# Patient Record
Sex: Female | Born: 1985 | Race: Black or African American | Hispanic: No | Marital: Single | Smoking: Never smoker
Health system: Southern US, Community
[De-identification: ages and names within clinical notes are randomized; demographics above are authoritative.]

## PROBLEM LIST (undated history)

## (undated) ENCOUNTER — Inpatient Hospital Stay (HOSPITAL_COMMUNITY): Payer: Self-pay

## (undated) DIAGNOSIS — O26879 Cervical shortening, unspecified trimester: Secondary | ICD-10-CM

## (undated) DIAGNOSIS — A630 Anogenital (venereal) warts: Secondary | ICD-10-CM

## (undated) DIAGNOSIS — IMO0002 Reserved for concepts with insufficient information to code with codable children: Secondary | ICD-10-CM

## (undated) DIAGNOSIS — M329 Systemic lupus erythematosus, unspecified: Secondary | ICD-10-CM

## (undated) HISTORY — PX: CHOLECYSTECTOMY: SHX55

## (undated) HISTORY — PX: OTHER SURGICAL HISTORY: SHX169

## (undated) HISTORY — DX: Anogenital (venereal) warts: A63.0

## (undated) HISTORY — DX: Cervical shortening, unspecified trimester: O26.879

---

## 2012-05-31 DIAGNOSIS — M329 Systemic lupus erythematosus, unspecified: Secondary | ICD-10-CM | POA: Insufficient documentation

## 2014-09-20 NOTE — L&D Delivery Note (Signed)
Delivery Note At 12:08 PM a viable and preterm female was delivered via Vaginal, Spontaneous Delivery (Presentation: Left Occiput Anterior).  APGAR: 4, 7; weight 2 lb 8.9 oz (1160 g).   Placenta status: Intact, Spontaneous.  Cord: 3 vessels with the following complications: None.   Anesthesia: Epidural  Episiotomy: None Lacerations: Labial;1st degree Suture Repair: 4.0 monocryl Est. Blood Loss (mL): 300  Mom to postpartum.  Baby to NICU.  Anne Mcdowell is a 29 y.o. female [phone] with IUP at 6969w1d admitted for preterm labor with history of LTCS x 1 and desire for TOLAC. She was s/p BMZ x 2 on admission with pessary placed. PPROM occurred on 10/27 while pt admitted. Pessary removed and latency abx given. Pt received 24 hour course of Magnesium Sulfate for neuroprotection during hospital course. [facility] with suspected active labor on 08/05/15.  A second course of BMZ was started with one injection on 11/15 at 0530.  Magnesium Sulfate was restarted for neuroprotection.  Pt received epidural for pain relief, then felt urge to push and was 5 cm, then quickly 10 cm per RN exam.  CNM called to room.  NICU team called and arrived.  Pt pushed ~10 minutes to deliver.  Cord clamped and cut by CNM and infant taken to NICU team.  Placenta intact and spontaneous, bleeding minimal.  First degree labial right and left lacerations.  Left labial laceration repaired with 4.0 monocryl, right labial hemostatic and not repaired.  Infant taken to NICU and FOB went with team to NICU.  Mom stable prior to transfer to postpartum.    LEFTWICH-KIRBY, LISA 08/05/2015, 1:59 PM

## 2015-02-23 ENCOUNTER — Encounter (HOSPITAL_COMMUNITY): Payer: Self-pay | Admitting: *Deleted

## 2015-02-23 ENCOUNTER — Emergency Department (HOSPITAL_COMMUNITY)
Admission: EM | Admit: 2015-02-23 | Discharge: 2015-02-23 | Disposition: A | Discharge status: Home / Self Care | Payer: 59 | Attending: Emergency Medicine | Admitting: Emergency Medicine

## 2015-02-23 ENCOUNTER — Emergency Department (HOSPITAL_COMMUNITY): Payer: 59

## 2015-02-23 DIAGNOSIS — O209 Hemorrhage in early pregnancy, unspecified: Secondary | ICD-10-CM | POA: Insufficient documentation

## 2015-02-23 DIAGNOSIS — Z8739 Personal history of other diseases of the musculoskeletal system and connective tissue: Secondary | ICD-10-CM | POA: Diagnosis not present

## 2015-02-23 DIAGNOSIS — Z880 Allergy status to penicillin: Secondary | ICD-10-CM | POA: Insufficient documentation

## 2015-02-23 DIAGNOSIS — O9989 Other specified diseases and conditions complicating pregnancy, childbirth and the puerperium: Secondary | ICD-10-CM | POA: Diagnosis not present

## 2015-02-23 DIAGNOSIS — R109 Unspecified abdominal pain: Secondary | ICD-10-CM | POA: Diagnosis not present

## 2015-02-23 DIAGNOSIS — N939 Abnormal uterine and vaginal bleeding, unspecified: Secondary | ICD-10-CM

## 2015-02-23 DIAGNOSIS — Z3490 Encounter for supervision of normal pregnancy, unspecified, unspecified trimester: Secondary | ICD-10-CM

## 2015-02-23 DIAGNOSIS — Z3A01 Less than 8 weeks gestation of pregnancy: Secondary | ICD-10-CM | POA: Diagnosis not present

## 2015-02-23 HISTORY — DX: Systemic lupus erythematosus, unspecified: M32.9

## 2015-02-23 HISTORY — DX: Reserved for concepts with insufficient information to code with codable children: IMO0002

## 2015-02-23 LAB — COMPREHENSIVE METABOLIC PANEL
ALT: 14 U/L (ref 14–54)
AST: 19 U/L (ref 15–41)
Albumin: 3.9 g/dL (ref 3.5–5.0)
Alkaline Phosphatase: 44 U/L (ref 38–126)
Anion gap: 6 (ref 5–15)
BILIRUBIN TOTAL: 0.3 mg/dL (ref 0.3–1.2)
BUN: 7 mg/dL (ref 6–20)
CALCIUM: 9.3 mg/dL (ref 8.9–10.3)
CHLORIDE: 105 mmol/L (ref 101–111)
CO2: 25 mmol/L (ref 22–32)
CREATININE: 0.74 mg/dL (ref 0.44–1.00)
GFR calc non Af Amer: >60 mL/min (ref >60)
Glucose, Bld: 100 mg/dL — ABNORMAL HIGH (ref 65–99)
Potassium: 4 mmol/L (ref 3.5–5.1)
Sodium: 136 mmol/L (ref 135–145)
Total Protein: 7.6 g/dL (ref 6.5–8.1)

## 2015-02-23 LAB — CBC
HCT: 34.5 % — ABNORMAL LOW (ref 36.0–46.0)
HEMOGLOBIN: 11.8 g/dL — AB (ref 12.0–15.0)
MCH: 25.7 pg — AB (ref 26.0–34.0)
MCHC: 34.2 g/dL (ref 30.0–36.0)
MCV: 75.2 fL — ABNORMAL LOW (ref 78.0–100.0)
PLATELETS: 178 10*3/uL (ref 150–400)
RBC: 4.59 MIL/uL (ref 3.87–5.11)
RDW: 13.3 % (ref 11.5–15.5)
WBC: 3.4 10*3/uL — AB (ref 4.0–10.5)

## 2015-02-23 LAB — URINALYSIS, ROUTINE W REFLEX MICROSCOPIC
Bilirubin Urine: NEGATIVE
GLUCOSE, UA: NEGATIVE mg/dL
Ketones, ur: NEGATIVE mg/dL
Nitrite: NEGATIVE
Protein, ur: NEGATIVE mg/dL
SPECIFIC GRAVITY, URINE: 1.015 (ref 1.005–1.030)
Urobilinogen, UA: 0.2 mg/dL (ref 0.0–1.0)
pH: 6 (ref 5.0–8.0)

## 2015-02-23 LAB — URINE MICROSCOPIC-ADD ON

## 2015-02-23 LAB — ABO/RH: ABO/RH(D): O POS

## 2015-02-23 LAB — I-STAT BETA HCG BLOOD, ED (MC, WL, AP ONLY): I-stat hCG, quantitative: >2000 m[IU]/mL — ABNORMAL HIGH (ref <5)

## 2015-02-23 LAB — WET PREP, GENITAL
Trich, Wet Prep: NONE SEEN
Yeast Wet Prep HPF POC: NONE SEEN

## 2015-02-23 LAB — OB RESULTS CONSOLE GC/CHLAMYDIA: Gonorrhea: NEGATIVE

## 2015-02-23 NOTE — ED Notes (Signed)
Patient states" She is tired and hungry and tired of waiting here."  She wants here results now so she can leave.

## 2015-02-23 NOTE — Discharge Instructions (Signed)
First Trimester of Pregnancy The first trimester of pregnancy is from week 1 until the end of week 12 (months 1 through 3). A week after a sperm fertilizes an egg, the egg will implant on the wall of the uterus. This embryo will begin to develop into a baby. Genes from you and your partner are forming the baby. The female genes determine whether the baby is a boy or a girl. At 6-8 weeks, the eyes and face are formed, and the heartbeat can be seen on ultrasound. At the end of 12 weeks, all the baby's organs are formed.  Now that you are pregnant, you will want to do everything you can to have a healthy baby. Two of the most important things are to get good prenatal care and to follow your health care provider's instructions. Prenatal care is all the medical care you receive before the baby's birth. This care will help prevent, find, and treat any problems during the pregnancy and childbirth. BODY CHANGES Your body goes through many changes during pregnancy. The changes vary from woman to woman.   You may gain or lose a couple of pounds at first.  You may feel sick to your stomach (nauseous) and throw up (vomit). If the vomiting is uncontrollable, call your health care provider.  You may tire easily.  You may develop headaches that can be relieved by medicines approved by your health care provider.  You may urinate more often. Painful urination may mean you have a bladder infection.  You may develop heartburn as a result of your pregnancy.  You may develop constipation because certain hormones are causing the muscles that push waste through your intestines to slow down.  You may develop hemorrhoids or swollen, bulging veins (varicose veins).  Your breasts may begin to grow larger and become tender. Your nipples may stick out more, and the tissue that surrounds them (areola) may become darker.  Your gums may bleed and may be sensitive to brushing and flossing.  Dark spots or blotches (chloasma,  mask of pregnancy) may develop on your face. This will likely fade after the baby is born.  Your menstrual periods will stop.  You may have a loss of appetite.  You may develop cravings for certain kinds of food.  You may have changes in your emotions from day to day, such as being excited to be pregnant or being concerned that something may go wrong with the pregnancy and baby.  You may have more vivid and strange dreams.  You may have changes in your hair. These can include thickening of your hair, rapid growth, and changes in texture. Some women also have hair loss during or after pregnancy, or hair that feels dry or thin. Your hair will most likely return to normal after your baby is born. WHAT TO EXPECT AT YOUR PRENATAL VISITS During a routine prenatal visit:  You will be weighed to make sure you and the baby are growing normally.  Your blood pressure will be taken.  Your abdomen will be measured to track your baby's growth.  The fetal heartbeat will be listened to starting around week 10 or 12 of your pregnancy.  Test results from any previous visits will be discussed. Your health care provider may ask you:  How you are feeling.  If you are feeling the baby move.  If you have had any abnormal symptoms, such as leaking fluid, bleeding, severe headaches, or abdominal cramping.  If you have any questions. Other tests   that may be performed during your first trimester include:  Blood tests to find your blood type and to check for the presence of any previous infections. They will also be used to check for low iron levels (anemia) and Rh antibodies. Later in the pregnancy, blood tests for diabetes will be done along with other tests if problems develop.  Urine tests to check for infections, diabetes, or protein in the urine.  An ultrasound to confirm the proper growth and development of the baby.  An amniocentesis to check for possible genetic problems.  Fetal screens for  spina bifida and Down syndrome.  You may need other tests to make sure you and the baby are doing well. HOME CARE INSTRUCTIONS  Medicines  Follow your health care provider's instructions regarding medicine use. Specific medicines may be either safe or unsafe to take during pregnancy.  Take your prenatal vitamins as directed.  If you develop constipation, try taking a stool softener if your health care provider approves. Diet  Eat regular, well-balanced meals. Choose a variety of foods, such as meat or vegetable-based protein, fish, milk and low-fat dairy products, vegetables, fruits, and whole grain breads and cereals. Your health care provider will help you determine the amount of weight gain that is right for you.  Avoid raw meat and uncooked cheese. These carry germs that can cause birth defects in the baby.  Eating four or five small meals rather than three large meals a day may help relieve nausea and vomiting. If you start to feel nauseous, eating a few soda crackers can be helpful. Drinking liquids between meals instead of during meals also seems to help nausea and vomiting.  If you develop constipation, eat more high-fiber foods, such as fresh vegetables or fruit and whole grains. Drink enough fluids to keep your urine clear or pale yellow. Activity and Exercise  Exercise only as directed by your health care provider. Exercising will help you:  Control your weight.  Stay in shape.  Be prepared for labor and delivery.  Experiencing pain or cramping in the lower abdomen or low back is a good sign that you should stop exercising. Check with your health care provider before continuing normal exercises.  Try to avoid standing for long periods of time. Move your legs often if you must stand in one place for a long time.  Avoid heavy lifting.  Wear low-heeled shoes, and practice good posture.  You may continue to have sex unless your health care provider directs you  otherwise. Relief of Pain or Discomfort  Wear a good support bra for breast tenderness.   Take warm sitz baths to soothe any pain or discomfort caused by hemorrhoids. Use hemorrhoid cream if your health care provider approves.   Rest with your legs elevated if you have leg cramps or low back pain.  If you develop varicose veins in your legs, wear support hose. Elevate your feet for 15 minutes, 3-4 times a day. Limit salt in your diet. Prenatal Care  Schedule your prenatal visits by the twelfth week of pregnancy. They are usually scheduled monthly at first, then more often in the last 2 months before delivery.  Write down your questions. Take them to your prenatal visits.  Keep all your prenatal visits as directed by your health care provider. Safety  Wear your seat belt at all times when driving.  Make a list of emergency phone numbers, including numbers for family, friends, the hospital, and police and fire departments. General Tips    Ask your health care provider for a referral to a local prenatal education class. Begin classes no later than at the beginning of month 6 of your pregnancy.  Ask for help if you have counseling or nutritional needs during pregnancy. Your health care provider can offer advice or refer you to specialists for help with various needs.  Do not use hot tubs, steam rooms, or saunas.  Do not douche or use tampons or scented sanitary pads.  Do not cross your legs for long periods of time.  Avoid cat litter boxes and soil used by cats. These carry germs that can cause birth defects in the baby and possibly loss of the fetus by miscarriage or stillbirth.  Avoid all smoking, herbs, alcohol, and medicines not prescribed by your health care provider. Chemicals in these affect the formation and growth of the baby.  Schedule a dentist appointment. At home, brush your teeth with a soft toothbrush and be gentle when you floss. SEEK MEDICAL CARE IF:   You have  dizziness.  You have mild pelvic cramps, pelvic pressure, or nagging pain in the abdominal area.  You have persistent nausea, vomiting, or diarrhea.  You have a bad smelling vaginal discharge.  You have pain with urination.  You notice increased swelling in your face, hands, legs, or ankles. SEEK IMMEDIATE MEDICAL CARE IF:   You have a fever.  You are leaking fluid from your vagina.  You have spotting or bleeding from your vagina.  You have severe abdominal cramping or pain.  You have rapid weight gain or loss.  You vomit blood or material that looks like coffee grounds.  You are exposed to German measles and have never had them.  You are exposed to fifth disease or chickenpox.  You develop a severe headache.  You have shortness of breath.  You have any kind of trauma, such as from a fall or a car accident. Document Released: 08/31/2001 Document Revised: 01/21/2014 Document Reviewed: 07/17/2013 Exit[employer] Patient Information 2015 [employer]. This information is not intended to replace advice given to you by your health care provider. Make sure you discuss any questions you have with your health care provider.  

## 2015-02-23 NOTE — ED Provider Notes (Signed)
CSN: [id number]     Arrival date & time 02/23/15  1539 History   First MD Initiated Contact with Patient 02/23/15 1952     Chief Complaint  Patient presents with  . Vaginal Bleeding  . Abdominal Cramping     (Consider location/radiation/quality/duration/timing/severity/associated sxs/prior Treatment) HPI Anne Mcdowell is a 29 y.o. female G4 P 0121 who comes in for evaluation of new onset pregnancy and abdominal cramping. Patient states she got up she was pregnant [redacted] weeks ago, developed some mild abdominal cramping yesterday that was intermittent in nature and resolved on its own area she reports that she started to spot on Friday and Saturday, but denies any heavy bleeding or blood clots. Denies any fevers, chills abdominal pain, nausea or vomiting, chest pain, shortness of breath, urinary symptoms. Patient also complains of feeling like she has "cottonballs stuck in the back of my mouth". Sensation is present for the past week. No difficulties breathing, no cough.  Past Medical History  Diagnosis Date  . Lupus    Past Surgical History  Procedure Laterality Date  . Cholecystectomy    . Cesarean section     No family history on file. History  Substance Use Topics  . Smoking status: Never Smoker   . Smokeless tobacco: Not on file  . Alcohol Use: No   OB History    No data available     Review of Systems  A 10 point review of systems was completed and was negative except for pertinent positives and negatives as mentioned in the history of present illness    Allergies  Penicillins and Vancomycin  Home Medications   Prior to Admission medications   Medication Sig Start Date End Date Taking? Authorizing Provider  citalopram (CELEXA) 20 MG tablet Take 20 mg by mouth daily. 01/24/15  Yes Historical Provider, MD  esomeprazole (NEXIUM) 40 MG capsule Take 40 mg by mouth at bedtime.   Yes Historical Provider, MD  hydroxychloroquine (PLAQUENIL) 200 MG tablet Take 400 mg by mouth  daily. 01/24/15  Yes Historical Provider, MD   BP 118/60 mmHg  Pulse 90  Temp(Src) 98.6 F (37 C) (Oral)  Resp 16  Ht 5' (1.524 m)  Wt 176 lb (79.833 kg)  BMI 34.37 kg/m2  SpO2 100%  LMP 02/04/2015 Physical Exam  Constitutional: She is oriented to person, place, and time. She appears well-developed and well-nourished.  HENT:  Head: Normocephalic and atraumatic.  Mouth/Throat: Oropharynx is clear and moist.  Oropharynx clear and moist without erythema or exudate. No glossal elevation or trismus. No evidence of retropharyngeal or peritonsillar abscess. Tolerating secretions well. Patent airway.  Eyes: Conjunctivae are normal. Pupils are equal, round, and reactive to light. Right eye exhibits no discharge. Left eye exhibits no discharge. No scleral icterus.  Neck: Neck supple.  Cardiovascular: Normal rate, regular rhythm and normal heart sounds.   Pulmonary/Chest: Effort normal and breath sounds normal. No respiratory distress. She has no wheezes. She has no rales.  Abdominal: Soft. There is no tenderness.  Genitourinary:  Chaperone was present for the entire genital exam. No lesions or rashes appreciated on vulva. Cervix visualized on speculum exam and appropriate cultures sampled. Cervical os is closed Scant blood in vaginal vault. Discharge: Scant clear yellow. Upon bi manual exam- No TTP of the adnexa, no cervical motion tenderness. No fullness or masses appreciated. No abnormalities appreciated in structural anatomy.   Musculoskeletal: She exhibits no tenderness.  Neurological: She is alert and oriented to person, place, and time.  Cranial Nerves II-XII grossly intact  Skin: Skin is warm and dry. No rash noted.  Psychiatric: She has a normal mood and affect.  Nursing note and vitals reviewed.   ED Course  Procedures (including critical care time) Labs Review Labs Reviewed  WET PREP, GENITAL - Abnormal; Notable for the following:    Clue Cells Wet Prep HPF POC FEW (*)     WBC, Wet Prep HPF POC FEW (*)    All other components within normal limits  CBC - Abnormal; Notable for the following:    WBC 3.4 (*)    Hemoglobin 11.8 (*)    HCT 34.5 (*)    MCV 75.2 (*)    MCH 25.7 (*)    All other components within normal limits  URINALYSIS, ROUTINE W REFLEX MICROSCOPIC (NOT AT [facility]) - Abnormal; Notable for the following:    APPearance CLOUDY (*)    Hgb urine dipstick LARGE (*)    Leukocytes, UA MODERATE (*)    All other components within normal limits  COMPREHENSIVE METABOLIC PANEL - Abnormal; Notable for the following:    Glucose, Bld 100 (*)    All other components within normal limits  URINE MICROSCOPIC-ADD ON - Abnormal; Notable for the following:    Squamous Epithelial / LPF FEW (*)    Bacteria, UA FEW (*)    All other components within normal limits  I-STAT BETA HCG BLOOD, ED ([facility], [facility], [facility] ONLY) - Abnormal; Notable for the following:    I-stat hCG, quantitative >2000.0 (*)    All other components within normal limits  HIV ANTIBODY (ROUTINE TESTING)  ABO/RH  GC/CHLAMYDIA PROBE AMP ([place]) NOT AT [facility]    Imaging Review US Ob Comp Less 14 Wks  02/23/2015   CLINICAL DATA:  Pregnant patient with vaginal bleeding for 2 days. Cramping.  EXAM: OBSTETRIC <14 WK [place] AND TRANSVAGINAL OB US  TECHNIQUE: Both transabdominal and transvaginal ultrasound examinations were performed for complete evaluation of the gestation as well as the maternal uterus, adnexal regions, and pelvic cul-de-sac. Transvaginal technique was performed to assess early pregnancy.  COMPARISON:  None.  FINDINGS: Intrauterine gestational sac: Visualized/normal in shape.  Yolk sac:  Present.  Embryo:  Present.  Cardiac Activity: Present.  Heart Rate: 120  bpm  CRL:  2.4  mm   5 w   6 d                  [place] EDC: 10/20/2015  Maternal uterus/adnexae: No subchorionic hemorrhage. The right ovary is normal measuring 1.7 x 1.6 x 3.7 cm blood flow. The left ovary is not seen. Multiple nabothian cysts are in  the cervix. Trace free fluid in the pelvis.  IMPRESSION: Single live intrauterine pregnancy estimated gestational age [redacted] weeks 6 days for estimated date of delivery 10/20/2015.   Electronically Signed   By: Rubye Oaks M.D.   On: 02/23/2015 21:09   US Ob Transvaginal  02/23/2015   CLINICAL DATA:  Pregnant patient with vaginal bleeding for 2 days. Cramping.  EXAM: OBSTETRIC <14 WK [place] AND TRANSVAGINAL OB US  TECHNIQUE: Both transabdominal and transvaginal ultrasound examinations were performed for complete evaluation of the gestation as well as the maternal uterus, adnexal regions, and pelvic cul-de-sac. Transvaginal technique was performed to assess early pregnancy.  COMPARISON:  None.  FINDINGS: Intrauterine gestational sac: Visualized/normal in shape.  Yolk sac:  Present.  Embryo:  Present.  Cardiac Activity: Present.  Heart Rate: 120  bpm  CRL:  2.4  mm   5 w   6 d                  US EDC: 10/20/2015  Maternal uterus/adnexae: No subchorionic hemorrhage. The right ovary is normal measuring 1.7 x 1.6 x 3.7 cm blood flow. The left ovary is not seen. Multiple nabothian cysts are in the cervix. Trace free fluid in the pelvis.  IMPRESSION: Single live intrauterine pregnancy estimated gestational age [redacted] weeks 6 days for estimated date of delivery 10/20/2015.   Electronically Signed   By: Rubye OaksMelanie  Ehinger M.D.   On: 02/23/2015 21:09     EKG Interpretation None     Meds given in ED:  Medications - No data to display  Discharge Medication List as of 02/23/2015 11:17 PM     Filed Vitals:   02/23/15 2136 02/23/15 2200 02/23/15 2230 02/23/15 2330  BP: 113/63 117/63 117/65 118/60  Pulse: 90 109 83 90  Temp:      TempSrc:      Resp: 12  16 16   Height:      Weight:      SpO2: 100% 100% 100% 100%    MDM  Vitals stable - WNL -afebrile Pt resting comfortably in ED. PE: Benign abdominal and pelvic exam. Grossly benign physical exam. Labwork: Patient is O+, Rhogam not required. No evidence of UTI,  suspect a dirty catch. Labs otherwise noncontributory. Imaging: Transvaginal OB ultrasound confirms intrauterine pregnancy, gestational age approximately 5 weeks.  No evidence of ectopic pregnancy, TOA, torsion. Patient given referral to follow up with OB/GYN for definitive prenatal care.  I discussed all relevant lab findings and imaging results with pt and they verbalized understanding. Discussed f/u with PCP within 48 hrs and return precautions, pt very amenable to plan.  Final diagnoses:  Pregnancy  Abdominal cramping        Joycie PeekBenjamin Gordan Grell, PA-C 02/24/15 0002  Mancel BaleElliott Wentz, MD 02/24/15 0010

## 2015-02-23 NOTE — ED Notes (Signed)
Pt states pink spotting on toilet paper and mild abdominal cramping.  Pt has had 1 positive pregnancy test, and thinks she's [redacted] weeks pregnant.  Hx of 2 miscarriages, but states those were different.  Pt also feels as if she has cotton in her throat. No redness noted.  Airway patent.

## 2015-02-24 LAB — GC/CHLAMYDIA PROBE AMP (~~LOC~~) NOT AT ARMC
Chlamydia: NEGATIVE
NEISSERIA GONORRHEA: NEGATIVE

## 2015-02-24 LAB — HIV ANTIBODY (ROUTINE TESTING W REFLEX): HIV SCREEN 4TH GENERATION: NONREACTIVE

## 2015-02-25 ENCOUNTER — Encounter (HOSPITAL_COMMUNITY): Payer: Self-pay | Admitting: *Deleted

## 2015-02-25 ENCOUNTER — Inpatient Hospital Stay (HOSPITAL_COMMUNITY)
Admission: AD | Admit: 2015-02-25 | Discharge: 2015-02-25 | Disposition: A | Discharge status: Home / Self Care | Payer: 59 | Source: Ambulatory Visit | Attending: Obstetrics & Gynecology | Admitting: Obstetrics & Gynecology

## 2015-02-25 DIAGNOSIS — N760 Acute vaginitis: Secondary | ICD-10-CM

## 2015-02-25 DIAGNOSIS — D6862 Lupus anticoagulant syndrome: Secondary | ICD-10-CM | POA: Diagnosis not present

## 2015-02-25 DIAGNOSIS — O209 Hemorrhage in early pregnancy, unspecified: Secondary | ICD-10-CM | POA: Diagnosis present

## 2015-02-25 DIAGNOSIS — B9689 Other specified bacterial agents as the cause of diseases classified elsewhere: Secondary | ICD-10-CM

## 2015-02-25 DIAGNOSIS — O99111 Other diseases of the blood and blood-forming organs and certain disorders involving the immune mechanism complicating pregnancy, first trimester: Secondary | ICD-10-CM | POA: Diagnosis not present

## 2015-02-25 DIAGNOSIS — Z3A01 Less than 8 weeks gestation of pregnancy: Secondary | ICD-10-CM | POA: Insufficient documentation

## 2015-02-25 DIAGNOSIS — O210 Mild hyperemesis gravidarum: Secondary | ICD-10-CM | POA: Diagnosis not present

## 2015-02-25 LAB — URINALYSIS, ROUTINE W REFLEX MICROSCOPIC
BILIRUBIN URINE: NEGATIVE
Glucose, UA: NEGATIVE mg/dL
KETONES UR: NEGATIVE mg/dL
NITRITE: NEGATIVE
Protein, ur: NEGATIVE mg/dL
UROBILINOGEN UA: 0.2 mg/dL (ref 0.0–1.0)
pH: 5.5 (ref 5.0–8.0)

## 2015-02-25 LAB — WET PREP, GENITAL
TRICH WET PREP: NONE SEEN
YEAST WET PREP: NONE SEEN

## 2015-02-25 LAB — URINE MICROSCOPIC-ADD ON

## 2015-02-25 MED ORDER — METOCLOPRAMIDE HCL 10 MG PO TABS
10.0000 mg | ORAL_TABLET | Freq: Once | ORAL | Status: AC
  Administered 2015-02-25: 10 mg via ORAL
  Filled 2015-02-25: qty 1

## 2015-02-25 MED ORDER — METRONIDAZOLE 500 MG PO TABS: 500.0000 mg | ORAL_TABLET | Freq: Two times a day (BID) | ORAL | Status: DC

## 2015-02-25 MED ORDER — METOCLOPRAMIDE HCL 10 MG PO TABS: 10.0000 mg | ORAL_TABLET | Freq: Four times a day (QID) | ORAL | Status: DC

## 2015-02-25 NOTE — MAU Provider Note (Signed)
History     CSN: [id number]  Arrival date and time: 02/25/15 1214   None     Chief Complaint  Patient presents with  . Vaginal Bleeding   HPI Pt is 2265w1d pregnant and was seen on 02/23/2015 for small amount of bleeding.- had confirmed IUP Pt denies clots or heavy bleeding- some cramps; pt has had vaginal odor- had wet prep on 6/5 that showed a few clue cells; had neg GC/chlamydia Pt has also been nauseated but not vomiting; has not taken anything for the nausea; pt is not eating b/c of nausea Pt has Lupus- [facility]  [place] 2 [place] ago - [facility] RN Note  Expand All Collapse All   Started bleeding on Friday, bleeding a little heavier today. Still having a little cramping. Called [facility] and was told to come here.           Past Medical History  Diagnosis Date  . Lupus     Past Surgical History  Procedure Laterality Date  . Cholecystectomy    . Cesarean section    . Elective abortion      History reviewed. No pertinent family history.  History  Substance Use Topics  . Smoking status: Never Smoker   . Smokeless tobacco: Not on file  . Alcohol Use: No    Allergies:  Allergies  Allergen Reactions  . Penicillins Hives and Swelling  . Vancomycin Swelling    Prescriptions prior to admission  Medication Sig Dispense Refill Last Dose  . citalopram (CELEXA) 20 MG tablet Take 20 mg by mouth daily.  0 Past Week at Unknown time  . esomeprazole (NEXIUM) 40 MG capsule Take 40 mg by mouth at bedtime.   02/22/2015 at Unknown time  . hydroxychloroquine (PLAQUENIL) 200 MG tablet Take 400 mg by mouth daily.  0 02/22/2015 at Unknown time    Review of Systems  Constitutional: Negative for fever and chills.  Gastrointestinal: Positive for nausea and abdominal pain. Negative for vomiting, diarrhea and constipation.  Genitourinary: Negative for dysuria and urgency.  Neurological: Negative for headaches.   Physical Exam   Blood  pressure 116/69, pulse 86, temperature 98.4 F (36.9 C), temperature source Oral, resp. rate 18, height 4' 11.5" (1.511 m), weight 173 lb (78.472 kg), last menstrual period 02/04/2015.  Physical Exam  Nursing note and vitals reviewed. Constitutional: She is oriented to person, place, and time. She appears well-developed and well-nourished. No distress.  HENT:  Head: Normocephalic.  Eyes: Pupils are equal, round, and reactive to light.  Neck: Normal range of motion. Neck supple.  Cardiovascular: Normal rate.   Respiratory: Effort normal.  GI: Soft. She exhibits no distension. There is no tenderness. There is no rebound and no guarding.  Genitourinary:  Small amount of dark red blood in vault; cervix closed, NT; uterus NSSC NT; adnexa without palpable enlargement or tenderness  Musculoskeletal: Normal range of motion.  Neurological: She is alert and oriented to person, place, and time.  Skin: Skin is warm and dry.  Psychiatric: She has a normal mood and affect.    MAU Course  Procedures Results for orders placed or performed during the hospital encounter of 02/25/15 (from the past 24 hour(s))  Urinalysis, Routine w reflex microscopic (not [facility])     Status: Abnormal   Collection Time: 02/25/15 12:32 PM  Result Value Ref Range   Color, Urine YELLOW YELLOW   APPearance CLEAR CLEAR   Specific Gravity,  Urine >1.030 (H) 1.005 - 1.030   pH 5.5 5.0 - 8.0   Glucose, UA NEGATIVE NEGATIVE mg/dL   Hgb urine dipstick LARGE (A) NEGATIVE   Bilirubin Urine NEGATIVE NEGATIVE   Ketones, ur NEGATIVE NEGATIVE mg/dL   Protein, ur NEGATIVE NEGATIVE mg/dL   Urobilinogen, UA 0.2 0.0 - 1.0 mg/dL   Nitrite NEGATIVE NEGATIVE   Leukocytes, UA TRACE (A) NEGATIVE  Urine microscopic-add on     Status: Abnormal   Collection Time: 02/25/15 12:32 PM  Result Value Ref Range   Squamous Epithelial / LPF MANY (A) RARE   WBC, UA 3-6 <3 WBC/hpf   RBC / HPF 3-6 <3 RBC/hpf   Bacteria, UA RARE RARE   Urine-Other  MUCOUS PRESENT   wet prep Reglan  PO given for nausea- which helped Assessment and Plan  Bleeding in pregnancy- SLIUP [redacted]w[redacted]d Nausea- Reglan Lupus F/u HD then refer to HR clinic [facility] 02/25/2015, 12:52 PM

## 2015-02-25 NOTE — MAU Note (Addendum)
Started bleeding on Friday, bleeding a little heavier today.  Still having a little cramping. Called [facility] and was told to come here.

## 2015-02-25 NOTE — MAU Note (Signed)
Pt states she had a pelvic exam on Sunday.

## 2015-02-25 NOTE — Discharge Instructions (Signed)
Safe Medications in Pregnancy   Acne: Benzoyl Peroxide Salicylic Acid  Backache/Headache: Tylenol: 2 regular strength every 4 hours OR              2 Extra strength every 6 hours  Colds/Coughs/Allergies: Benadryl (alcohol free) 25 mg every 6 hours as needed Breath right strips Claritin Cepacol throat lozenges Chloraseptic throat spray Cold-Eeze- up to three times per day Cough drops, alcohol free Flonase (by prescription only) Guaifenesin Mucinex Robitussin DM (plain only, alcohol free) Saline nasal spray/drops Sudafed (pseudoephedrine) & Actifed ** use only after [redacted] weeks gestation and if you do not have high blood pressure Tylenol Vicks Vaporub Zinc lozenges Zyrtec   Constipation: Colace Ducolax suppositories Fleet enema Glycerin suppositories Metamucil Milk of magnesia Miralax Senokot Smooth move tea  Diarrhea: Kaopectate Imodium A-D  *NO pepto Bismol  Hemorrhoids: Anusol Anusol HC Preparation H Tucks  Indigestion: Tums Maalox Mylanta Zantac  Pepcid  Insomnia: Benadryl (alcohol free) 25mg  every 6 hours as needed Tylenol PM Unisom, no Gelcaps  Leg Cramps: Tums MagGel  Nausea/Vomiting:  Bonine Dramamine Emetrol Ginger extract Sea bands Meclizine  Nausea medication to take during pregnancy:  Unisom (doxylamine succinate 25 mg tablets) Take one tablet daily at bedtime. If symptoms are not adequately controlled, the dose can be increased to a maximum recommended dose of two tablets daily (1/2 tablet in the morning, 1/2 tablet mid-afternoon and one at bedtime). Vitamin B6 100mg  tablets. Take one tablet twice a day (up to 200 mg per day).  Skin Rashes: Aveeno products Benadryl cream or 25mg  every 6 hours as needed Calamine Lotion 1% cortisone cream  Yeast infection: Gyne-lotrimin 7 Monistat 7   **If taking multiple medications, please check labels to avoid duplicating the same active ingredients **take medication as directed on  the label ** Do not exceed 4000 mg of tylenol in 24 hours **Do not take medications that contain aspirin or ibuprofe    ________________________________________     To schedule your Maternity Eligibility Appointment, please call [phone].  When you arrive for your appointment you must bring the following items or information listed below.  Your appointment will be rescheduled if you do not have these items or are 15 minutes late. If currently receiving Medicaid, you MUST bring: 1. Medicaid Card 2. Social Security Card 3. Picture ID 4. Proof of Pregnancy 5. Verification of current address if the address on Medicaid card is incorrect "postmarked mail" If not receiving Medicaid, you MUST bring: 1. Social Security Card 2. Picture ID 3. Birth Certificate (if available) Passport or *Green Card 4. Proof of Pregnancy 5. Verification of current address "postmarked mail" for each income presented. 6. Verification of insurance coverage, if any 7. Check stubs from each employer for the previous month (if unable to present check stub  for each week, we will accept check stub for the first and last week ill the same month.) If you can't locate check stubs, you must bring a letter from the employer(s) and it must have the following information on letterhead, typed, in English: o name of company o company telephone number o how long been with the company, if less than one month o how much person earns per hour o how many hours per week work o the gross pay the person earned for the previous month If you are 29 years old or less, you do not have to bring proof of income unless you work or live with the father of the baby and at that time we  will need proof of income from you and/or the father of the baby. Green Card recipients are eligible for Medicaid for Pregnant Women (MPW)

## 2015-03-04 ENCOUNTER — Inpatient Hospital Stay (HOSPITAL_COMMUNITY)
Admission: AD | Admit: 2015-03-04 | Discharge: 2015-03-05 | Discharge status: Against Medical Advice | Payer: Medicaid Other | Source: Ambulatory Visit | Attending: Family Medicine | Admitting: Family Medicine

## 2015-03-04 ENCOUNTER — Encounter (HOSPITAL_COMMUNITY): Payer: Self-pay | Admitting: *Deleted

## 2015-03-04 DIAGNOSIS — Z5320 Procedure and treatment not carried out because of patient's decision for unspecified reasons: Secondary | ICD-10-CM | POA: Diagnosis not present

## 2015-03-04 DIAGNOSIS — O9989 Other specified diseases and conditions complicating pregnancy, childbirth and the puerperium: Secondary | ICD-10-CM | POA: Diagnosis present

## 2015-03-04 LAB — URINE MICROSCOPIC-ADD ON

## 2015-03-04 LAB — URINALYSIS, ROUTINE W REFLEX MICROSCOPIC
Bilirubin Urine: NEGATIVE
Glucose, UA: NEGATIVE mg/dL
Ketones, ur: 15 mg/dL — AB
Nitrite: NEGATIVE
Protein, ur: NEGATIVE mg/dL
Specific Gravity, Urine: >1.03 — ABNORMAL HIGH (ref 1.005–1.030)
Urobilinogen, UA: 0.2 mg/dL (ref 0.0–1.0)
pH: 6 (ref 5.0–8.0)

## 2015-03-04 NOTE — MAU Note (Signed)
PT  SAYS S HE  IS  WEAK  - STARTED  YESTERDAY .    SAYS VOMITING     STARTED ON Friday  .    ATE LAST   BAGEL,   LAST DRANK -WATER.    SAYS     SHE  HAS  SLEPT  ALL DAY  .  PLANS  TO GO  TO CLINIC  FOR  [facility].   LEFT SIDE  STARTED  HURTING  TODAY.    LAST  SEX-  6-3

## 2015-03-04 NOTE — MAU Note (Signed)
ANSWERED  QUESTIONS  AND EXPLAINED  AGAIN  ABOUT  PRIORITY  OF  TRIAGE    PTS.

## 2015-03-05 NOTE — MAU Note (Signed)
Pt not in lobby.  

## 2015-03-11 LAB — CULTURE, OB URINE: Culture: NO GROWTH

## 2015-03-13 ENCOUNTER — Encounter (HOSPITAL_COMMUNITY): Payer: Self-pay | Admitting: *Deleted

## 2015-03-13 ENCOUNTER — Inpatient Hospital Stay (HOSPITAL_COMMUNITY)
Admission: AD | Admit: 2015-03-13 | Discharge: 2015-03-14 | Disposition: A | Discharge status: Home / Self Care | Payer: Medicaid Other | Source: Ambulatory Visit | Attending: Family Medicine | Admitting: Family Medicine

## 2015-03-13 DIAGNOSIS — O210 Mild hyperemesis gravidarum: Secondary | ICD-10-CM | POA: Insufficient documentation

## 2015-03-13 DIAGNOSIS — Z3A08 8 weeks gestation of pregnancy: Secondary | ICD-10-CM | POA: Diagnosis not present

## 2015-03-13 DIAGNOSIS — R197 Diarrhea, unspecified: Secondary | ICD-10-CM | POA: Diagnosis present

## 2015-03-13 DIAGNOSIS — R112 Nausea with vomiting, unspecified: Secondary | ICD-10-CM | POA: Diagnosis not present

## 2015-03-13 DIAGNOSIS — O2341 Unspecified infection of urinary tract in pregnancy, first trimester: Secondary | ICD-10-CM | POA: Diagnosis not present

## 2015-03-13 LAB — URINALYSIS, ROUTINE W REFLEX MICROSCOPIC
Bilirubin Urine: NEGATIVE
Glucose, UA: NEGATIVE mg/dL
Ketones, ur: NEGATIVE mg/dL
Leukocytes, UA: NEGATIVE
Nitrite: POSITIVE — AB
Protein, ur: NEGATIVE mg/dL
Specific Gravity, Urine: >1.03 — ABNORMAL HIGH (ref 1.005–1.030)
Urobilinogen, UA: 0.2 mg/dL (ref 0.0–1.0)
pH: 6 (ref 5.0–8.0)

## 2015-03-13 LAB — COMPREHENSIVE METABOLIC PANEL
ALT: 11 U/L — ABNORMAL LOW (ref 14–54)
AST: 15 U/L (ref 15–41)
Albumin: 3.9 g/dL (ref 3.5–5.0)
Alkaline Phosphatase: 36 U/L — ABNORMAL LOW (ref 38–126)
Anion gap: 4 — ABNORMAL LOW (ref 5–15)
BUN: 6 mg/dL (ref 6–20)
CO2: 25 mmol/L (ref 22–32)
Calcium: 9.1 mg/dL (ref 8.9–10.3)
Chloride: 107 mmol/L (ref 101–111)
Creatinine, Ser: 0.56 mg/dL (ref 0.44–1.00)
GFR calc Af Amer: >60 mL/min (ref >60)
GFR calc non Af Amer: >60 mL/min (ref >60)
Glucose, Bld: 93 mg/dL (ref 65–99)
Potassium: 3.7 mmol/L (ref 3.5–5.1)
Sodium: 136 mmol/L (ref 135–145)
Total Bilirubin: 0.2 mg/dL — ABNORMAL LOW (ref 0.3–1.2)
Total Protein: 7 g/dL (ref 6.5–8.1)

## 2015-03-13 LAB — CBC
HCT: 35.5 % — ABNORMAL LOW (ref 36.0–46.0)
Hemoglobin: 12.5 g/dL (ref 12.0–15.0)
MCH: 26.2 pg (ref 26.0–34.0)
MCHC: 35.2 g/dL (ref 30.0–36.0)
MCV: 74.4 fL — ABNORMAL LOW (ref 78.0–100.0)
Platelets: 174 10*3/uL (ref 150–400)
RBC: 4.77 MIL/uL (ref 3.87–5.11)
RDW: 13.3 % (ref 11.5–15.5)
WBC: 4.9 10*3/uL (ref 4.0–10.5)

## 2015-03-13 LAB — URINE MICROSCOPIC-ADD ON

## 2015-03-13 MED ORDER — SODIUM CHLORIDE 0.9 % IV SOLN
25.0000 mg | Freq: Once | INTRAVENOUS | Status: AC
  Administered 2015-03-13: 25 mg via INTRAVENOUS
  Filled 2015-03-13: qty 1

## 2015-03-13 MED ORDER — LOPERAMIDE HCL 2 MG PO CAPS
4.0000 mg | ORAL_CAPSULE | Freq: Once | ORAL | Status: AC
  Administered 2015-03-14: 4 mg via ORAL
  Filled 2015-03-13: qty 2

## 2015-03-13 NOTE — MAU Note (Addendum)
Pt reports N/V/D for 2 days. Pt has antiemetics at home. Has not taken them since last week because "the medicine doesn't work, it just makes me sleepy". Pt also reports a fever blister on her lip and would like some medicine for it. Pt also reports bleeding off and on with this pregnancy. Last episode 2 days ago

## 2015-03-14 DIAGNOSIS — R112 Nausea with vomiting, unspecified: Secondary | ICD-10-CM

## 2015-03-14 MED ORDER — SODIUM CHLORIDE 0.9 % IV SOLN
8.0000 mg | Freq: Once | INTRAVENOUS | Status: DC
  Filled 2015-03-14: qty 4

## 2015-03-14 MED ORDER — LACTATED RINGERS IV BOLUS (SEPSIS)
1000.0000 mL | Freq: Once | INTRAVENOUS | Status: AC
  Administered 2015-03-14: 1000 mL via INTRAVENOUS

## 2015-03-14 MED ORDER — NITROFURANTOIN MONOHYD MACRO 100 MG PO CAPS: 100.0000 mg | ORAL_CAPSULE | Freq: Two times a day (BID) | ORAL | Status: AC

## 2015-03-14 MED ORDER — PROMETHAZINE HCL 25 MG PO TABS
25.0000 mg | ORAL_TABLET | Freq: Four times a day (QID) | ORAL | Status: DC | PRN
Start: 2015-03-14 — End: 2015-05-01

## 2015-03-14 MED ORDER — METOCLOPRAMIDE HCL 5 MG/ML IJ SOLN
10.0000 mg | Freq: Once | INTRAMUSCULAR | Status: AC
  Administered 2015-03-14: 10 mg via INTRAVENOUS
  Filled 2015-03-14: qty 2

## 2015-03-14 NOTE — Discharge Instructions (Signed)
Nausea and Vomiting Nausea is a sick feeling that often comes before throwing up (vomiting). Vomiting is a reflex where stomach contents come out of your mouth. Vomiting can cause severe loss of body fluids (dehydration). Children and elderly adults can become dehydrated quickly, especially if they also have diarrhea. Nausea and vomiting are symptoms of a condition or disease. It is important to find the cause of your symptoms. CAUSES   Direct irritation of the stomach lining. This irritation can result from increased acid production (gastroesophageal reflux disease), infection, food poisoning, taking certain medicines (such as nonsteroidal anti-inflammatory drugs), alcohol use, or tobacco use.  Signals from the brain.These signals could be caused by a headache, heat exposure, an inner ear disturbance, increased pressure in the brain from injury, infection, a tumor, or a concussion, pain, emotional stimulus, or metabolic problems.  An obstruction in the gastrointestinal tract (bowel obstruction).  Illnesses such as diabetes, hepatitis, gallbladder problems, appendicitis, kidney problems, cancer, sepsis, atypical symptoms of a heart attack, or eating disorders.  Medical treatments such as chemotherapy and radiation.  Receiving medicine that makes you sleep (general anesthetic) during surgery. DIAGNOSIS Your caregiver may ask for tests to be done if the problems do not improve after a few days. Tests may also be done if symptoms are severe or if the reason for the nausea and vomiting is not clear. Tests may include:  Urine tests.  Blood tests.  Stool tests.  Cultures (to look for evidence of infection).  X-rays or other imaging studies. Test results can help your caregiver make decisions about treatment or the need for additional tests. TREATMENT You need to stay well hydrated. Drink frequently but in small amounts.You may wish to drink water, sports drinks, clear broth, or eat frozen  ice pops or gelatin dessert to help stay hydrated.When you eat, eating slowly may help prevent nausea.There are also some antinausea medicines that may help prevent nausea. HOME CARE INSTRUCTIONS   Take all medicine as directed by your caregiver.  If you do not have an appetite, do not force yourself to eat. However, you must continue to drink fluids.  If you have an appetite, eat a normal diet unless your caregiver tells you differently.  Eat a variety of complex carbohydrates (rice, wheat, potatoes, bread), lean meats, yogurt, fruits, and vegetables.  Avoid high-fat foods because they are more difficult to digest.  Drink enough water and fluids to keep your urine clear or pale yellow.  If you are dehydrated, ask your caregiver for specific rehydration instructions. Signs of dehydration may include:  Severe thirst.  Dry lips and mouth.  Dizziness.  Dark urine.  Decreasing urine frequency and amount.  Confusion.  Rapid breathing or pulse. SEEK IMMEDIATE MEDICAL CARE IF:   You have blood or brown flecks (like coffee grounds) in your vomit.  You have black or bloody stools.  You have a severe headache or stiff neck.  You are confused.  You have severe abdominal pain.  You have chest pain or trouble breathing.  You do not urinate at least once every 8 hours.  You develop cold or clammy skin.  You continue to vomit for longer than 24 to 48 hours.  You have a fever. MAKE SURE YOU:   Understand these instructions.  Will watch your condition.  Will get help right away if you are not doing well or get worse. Document Released: 09/06/2005 Document Revised: 11/29/2011 Document Reviewed: 02/03/2011 Exit[employer] Patient Information 2015 [employer]. This information is not intended   to replace advice given to you by your health care provider. Make sure you discuss any questions you have with your health care provider. °Pregnancy and Urinary Tract Infection °A urinary  tract infection (UTI) is a bacterial infection of the urinary tract. Infection of the urinary tract can include the ureters, kidneys (pyelonephritis), bladder (cystitis), and urethra (urethritis). All pregnant women should be screened for bacteria in the urinary tract. Identifying and treating a UTI will decrease the risk of preterm labor and developing more serious infections in both the mother and baby. °CAUSES °Bacteria germs cause almost all UTIs.  °RISK FACTORS °Many factors can increase your chances of getting a UTI during pregnancy. These include: °· Having a short urethra. °· Poor toilet and hygiene habits. °· Sexual intercourse. °· Blockage of urine along the urinary tract. °· Problems with the pelvic muscles or nerves. °· Diabetes. °· Obesity. °· Bladder problems after having several children. °· Previous history of UTI. °SIGNS AND SYMPTOMS  °· Pain, burning, or a stinging feeling when urinating. °· Suddenly feeling the need to urinate right away (urgency). °· Loss of bladder control (urinary incontinence). °· Frequent urination, more than is common with pregnancy. °· Lower abdominal or back discomfort. °· Cloudy urine. °· Blood in the urine (hematuria). °· Fever.  °When the kidneys are infected, the symptoms may be: °· Back pain. °· Flank pain on the right side more so than the left. °· Fever. °· Chills. °· Nausea. °· Vomiting. °DIAGNOSIS  °A urinary tract infection is usually diagnosed through urine tests. Additional tests and procedures are sometimes done. These may include: °· Ultrasound exam of the kidneys, ureters, bladder, and urethra. °· Looking in the bladder with a lighted tube (cystoscopy). °TREATMENT °Typically, UTIs can be treated with antibiotic medicines.  °HOME CARE INSTRUCTIONS  °· Only take over-the-counter or prescription medicines as directed by your health care provider. If you were prescribed antibiotics, take them as directed. Finish them even if you start to feel better. °· Drink  enough fluids to keep your urine clear or pale yellow. °· Do not have sexual intercourse until the infection is gone and your health care provider says it is okay. °· Make sure you are tested for UTIs throughout your pregnancy. These infections often come back.  °Preventing a UTI in the Future °· Practice good toilet habits. Always wipe from front to back. Use the tissue only once. °· Do not hold your urine. Empty your bladder as soon as possible when the urge comes. °· Do not douche or use deodorant sprays. °· Wash with soap and warm water around the genital area and the anus. °· Empty your bladder before and after sexual intercourse. °· Wear underwear with a cotton crotch. °· Avoid caffeine and carbonated drinks. They can irritate the bladder. °· Drink cranberry juice or take cranberry pills. This may decrease the risk of getting a UTI. °· Do not drink alcohol. °· Keep all your appointments and tests as scheduled.  °SEEK MEDICAL CARE IF:  °· Your symptoms get worse. °· You are still having fevers 2 or more days after treatment begins. °· You have a rash. °· You feel that you are having problems with medicines prescribed. °· You have abnormal vaginal discharge. °SEEK IMMEDIATE MEDICAL CARE IF:  °· You have back or flank pain. °· You have chills. °· You have blood in your urine. °· You have nausea and vomiting. °· You have contractions of your uterus. °· You have a gush of fluid from the vagina. °  MAKE SURE YOU:  Understand these instructions.   Will watch your condition.   Will get help right away if you are not doing well or get worse.  Document Released: 01/01/2011 Document Revised: 06/27/2013 Document Reviewed: 04/05/2013 [facility] Patient Information 2015 [place], [place]. This information is not intended to replace advice given to you by your health care provider. Make sure you discuss any questions you have with your health care provider.

## 2015-03-14 NOTE — MAU Provider Note (Signed)
History     CSN: [id number]  Arrival date and time: 03/13/15 2009   First Provider Initiated Contact with Patient 03/13/15 2131      Chief Complaint  Patient presents with  . Emesis During Pregnancy  . Diarrhea   Diarrhea  This is a new problem. The current episode started today. The problem occurs 2 to 4 times per day. The stool consistency is described as watery. Associated symptoms include vomiting. Pertinent negatives include no fever.    29 y.o. [id number] [redacted]w[redacted]d presents to the MAU with the complaint of nausea/vomiting and diarrhea. No vaginal bleeding noted.  Past Medical History  Diagnosis Date  . Lupus     Past Surgical History  Procedure Laterality Date  . Cholecystectomy    . Cesarean section    . Elective abortion      History reviewed. No pertinent family history.  History  Substance Use Topics  . Smoking status: Never Smoker   . Smokeless tobacco: Not on file  . Alcohol Use: No    Allergies:  Allergies  Allergen Reactions  . Penicillins Hives and Swelling  . Vancomycin Hives and Swelling    Prescriptions prior to admission  Medication Sig Dispense Refill Last Dose  . citalopram (CELEXA) 20 MG tablet Take 20 mg by mouth at bedtime.   0 03/12/2015 at Unknown time  . esomeprazole (NEXIUM) 40 MG capsule Take 40 mg by mouth daily as needed (for reflux).    Past Week at Unknown time  . hydroxychloroquine (PLAQUENIL) 200 MG tablet Take 400 mg by mouth at bedtime.   0 03/12/2015 at Unknown time  . metoCLOPramide (REGLAN) 10 MG tablet Take 1 tablet (10 mg total) by mouth every 6 (six) hours. 30 tablet 0 Past Week at Unknown time  . metroNIDAZOLE (FLAGYL) 500 MG tablet Take 1 tablet (500 mg total) by mouth 2 (two) times daily. (Patient not taking: Reported on 03/13/2015) 14 tablet 0 Completed Course at Unknown time    Review of Systems  Constitutional: Negative for fever.  Gastrointestinal: Positive for nausea, vomiting and diarrhea.  All other systems  reviewed and are negative.  Physical Exam   Blood pressure 128/62, temperature 98.7 F (37.1 C), temperature source Oral, resp. rate 18, last menstrual period 02/04/2015. Results for orders placed or performed during the hospital encounter of 03/13/15 (from the past 24 hour(s))  Urinalysis, Routine w reflex microscopic (not at [facility])     Status: Abnormal   Collection Time: 03/13/15  8:40 PM  Result Value Ref Range   Color, Urine YELLOW YELLOW   APPearance CLEAR CLEAR   Specific Gravity, Urine >1.030 (H) 1.005 - 1.030   pH 6.0 5.0 - 8.0   Glucose, UA NEGATIVE NEGATIVE mg/dL   Hgb urine dipstick MODERATE (A) NEGATIVE   Bilirubin Urine NEGATIVE NEGATIVE   Ketones, ur NEGATIVE NEGATIVE mg/dL   Protein, ur NEGATIVE NEGATIVE mg/dL   Urobilinogen, UA 0.2 0.0 - 1.0 mg/dL   Nitrite POSITIVE (A) NEGATIVE   Leukocytes, UA NEGATIVE NEGATIVE  Urine microscopic-add on     Status: Abnormal   Collection Time: 03/13/15  8:40 PM  Result Value Ref Range   Squamous Epithelial / LPF FEW (A) RARE   WBC, UA 3-6 <3 WBC/hpf   RBC / HPF 0-2 <3 RBC/hpf   Bacteria, UA MANY (A) RARE   Urine-Other MUCOUS PRESENT   CBC     Status: Abnormal   Collection Time: 03/13/15 11:20 PM  Result Value Ref Range  WBC 4.9 4.0 - 10.5 K/uL   RBC 4.77 3.87 - 5.11 MIL/uL   Hemoglobin 12.5 12.0 - 15.0 g/dL   HCT 04.5 (L) 40.9 - 81.1 %   MCV 74.4 (L) 78.0 - 100.0 fL   MCH 26.2 26.0 - 34.0 pg   MCHC 35.2 30.0 - 36.0 g/dL   RDW 91.4 78.2 - 95.6 %   Platelets 174 150 - 400 K/uL  Comprehensive metabolic panel     Status: Abnormal   Collection Time: 03/13/15 11:20 PM  Result Value Ref Range   Sodium 136 135 - 145 mmol/L   Potassium 3.7 3.5 - 5.1 mmol/L   Chloride 107 101 - 111 mmol/L   CO2 25 22 - 32 mmol/L   Glucose, Bld 93 65 - 99 mg/dL   BUN 6 6 - 20 mg/dL   Creatinine, Ser 2.13 0.44 - 1.00 mg/dL   Calcium 9.1 8.9 - 08.6 mg/dL   Total Protein 7.0 6.5 - 8.1 g/dL   Albumin 3.9 3.5 - 5.0 g/dL   AST 15 15 - 41 U/L    ALT 11 (L) 14 - 54 U/L   Alkaline Phosphatase 36 (L) 38 - 126 U/L   Total Bilirubin 0.2 (L) 0.3 - 1.2 mg/dL   GFR calc non Af Amer >60 >60 mL/min   GFR calc Af Amer >60 >60 mL/min   Anion gap 4 (L) 5 - 15   Physical Exam  Nursing note and vitals reviewed. Constitutional: She is oriented to person, place, and time. She appears well-developed and well-nourished. No distress.  HENT:  Head: Normocephalic and atraumatic.  Neck: Normal range of motion. No thyromegaly present.  Respiratory: Effort normal. No respiratory distress.  GI: Soft. She exhibits no distension.  Musculoskeletal: Normal range of motion.  Neurological: She is alert and oriented to person, place, and time.  Skin: Skin is warm and dry.  Psychiatric: She has a normal mood and affect. Her behavior is normal. Judgment and thought content normal.    MAU Course  Procedures  MDM Iv hydration; antiemetics; antidiarrheal  Assessment and Plan  UTI N/V Diarrhea  Rx: for phenergan Macrobid Send urine for CX Discharge to home  [facility] [place] 03/14/2015, 1:52 AM

## 2015-03-15 LAB — CULTURE, OB URINE: Special Requests: NORMAL

## 2015-04-03 ENCOUNTER — Encounter: Payer: Self-pay | Admitting: Obstetrics & Gynecology

## 2015-04-03 ENCOUNTER — Ambulatory Visit (INDEPENDENT_AMBULATORY_CARE_PROVIDER_SITE_OTHER): Payer: Medicaid Other | Admitting: Obstetrics & Gynecology

## 2015-04-03 VITALS — BP 115/81 | HR 75 | Temp 98.4°F | Wt 165.9 lb

## 2015-04-03 DIAGNOSIS — O99011 Anemia complicating pregnancy, first trimester: Secondary | ICD-10-CM

## 2015-04-03 DIAGNOSIS — L930 Discoid lupus erythematosus: Secondary | ICD-10-CM | POA: Diagnosis not present

## 2015-04-03 DIAGNOSIS — O0991 Supervision of high risk pregnancy, unspecified, first trimester: Secondary | ICD-10-CM | POA: Diagnosis not present

## 2015-04-03 DIAGNOSIS — O99111 Other diseases of the blood and blood-forming organs and certain disorders involving the immune mechanism complicating pregnancy, first trimester: Secondary | ICD-10-CM

## 2015-04-03 DIAGNOSIS — D573 Sickle-cell trait: Secondary | ICD-10-CM

## 2015-04-03 DIAGNOSIS — O0990 Supervision of high risk pregnancy, unspecified, unspecified trimester: Secondary | ICD-10-CM | POA: Insufficient documentation

## 2015-04-03 LAB — POCT URINALYSIS DIP (DEVICE)
Bilirubin Urine: NEGATIVE
Glucose, UA: NEGATIVE mg/dL
Ketones, ur: NEGATIVE mg/dL
Nitrite: NEGATIVE
PROTEIN: 100 mg/dL — AB
Specific Gravity, Urine: >=1.03 (ref 1.005–1.030)
Urobilinogen, UA: 1 mg/dL (ref 0.0–1.0)
pH: 6.5 (ref 5.0–8.0)

## 2015-04-03 MED ORDER — SCOPOLAMINE 1 MG/3DAYS TD PT72: 1.0000 | MEDICATED_PATCH | TRANSDERMAL | Status: DC

## 2015-04-03 MED ORDER — ASPIRIN EC 81 MG PO TBEC: 81.0000 mg | DELAYED_RELEASE_TABLET | Freq: Every day | ORAL | Status: DC

## 2015-04-03 NOTE — Progress Notes (Signed)
First trimester ultrasound scheduled for July 26,2016 @ 10:15AM

## 2015-04-03 NOTE — Progress Notes (Signed)
Pain- epigastric when sleeping    Pt reports having sickle cell trait  New ob packet given

## 2015-04-03 NOTE — Progress Notes (Signed)
Nutrition note: 1st visit consult Pt was obese prior to pregnancy. Pt has Lupus. Pt has lost 6.1# @ 4135w3d. Pt reports she has had a lot of N&V. Pt reports eating 2 meals & 4 snacks/d. Pt is not taking a PNV (mostly due to N&V). Pt reports having heartburn occ. Pt received verbal & written education on general nutrition during pregnancy. Discussed benefits of BF. Discussed tips to decrease N&V (pt also reports she will be starting a med for N&V soon). Encouraged PNV or equivalent (2 chewable multivitamins). Discussed wt gain goals of 11-20# or 0.5#/wk in 2nd & 3rd trimester. Pt agrees to start taking a PNV soon. Pt does not have WIC but plans to apply soon. Pt plans to try to BF. F/u as needed Blondell RevealLaura Zandyr Barnhill, MS, RD, LDN, [facility]ptialBCLC

## 2015-04-03 NOTE — Patient Instructions (Signed)
Lupus Lupus (also called systemic lupus erythematosus, SLE) is a disorder of the body's natural defense system (immune system). In lupus, the immune system attacks various areas of the body (autoimmune disease). CAUSES The cause is unknown. However, lupus runs in families. Certain genes can make you more likely to develop lupus. It is 10 times more common in women than in men. Lupus is also more common in African Americans and Asians. Other factors also play a role, such as viruses (Epstein-Barr virus, EBV), stress, hormones, cigarette smoke, and certain drugs. SYMPTOMS Lupus can affect many parts of the body, including the joints, skin, kidneys, lungs, heart, nervous system, and blood vessels. The signs and symptoms of lupus differ from person to person. The disease can range from mild to life-threatening. Typical features of lupus include:  Butterfly-shaped rash over the face.  Arthritis involving one or more joints.  Kidney disease.  Fever, weight loss, hair loss, fatigue.  Poor circulation in the fingers and toes (Raynaud's disease).  Chest pain when taking deep breaths. Abdominal pain may also occur.  Skin rash in areas exposed to the sun.  Sores in the mouth and nose. DIAGNOSIS Diagnosing lupus can take a long time and is often difficult. An exam and an accurate account of your symptoms and health problems is very important. Blood tests are necessary, though no single test can confirm or rule out lupus. Most people with lupus test positive for antinuclear antibodies (ANA) on a blood test. Additional blood tests, a urine test (urinalysis), and sometimes a kidney or skin tissue sample (biopsy) can help to confirm or rule out lupus. TREATMENT There is no cure for lupus. Your caregiver will develop a treatment plan based on your age, sex, health, symptoms, and lifestyle. The goals are to prevent flares, to treat them when they do occur, and to minimize organ damage and complications. How  the disease may affect each person varies widely. Most people with lupus can live normal lives, but this disorder must be carefully monitored. Treatment must be adjusted as necessary to prevent serious complications. Medicines used for treatment:  Nonsteroidal anti-inflammatory drugs (NSAIDs) decrease inflammation and can help with chest pain, joint pain, and fevers. Examples include ibuprofen and naproxen.  Antimalarial drugs were designed to treat malaria. They also treat fatigue, joint pain, skin rashes, and inflammation of the lungs in patients with lupus.  Corticosteroids are powerful hormones that rapidly suppress inflammation. The lowest dose with the highest benefit will be chosen. They can be given by cream, pills, injections, and through the vein (intravenously).  Immunosuppressive drugs block the making of immune cells. They may be used for kidney or nerve disease. HOME CARE INSTRUCTIONS  Exercise. Low-impact activities can usually help keep joints flexible without being too strenuous.  Rest after periods of exercise.  Avoid excessive sun exposure.  Follow proper nutrition and take supplements as recommended by your caregiver.  Stress management can be helpful. SEEK MEDICAL CARE IF:  You have increased fatigue.  You develop pain.  You develop a rash.  You have an oral temperature above 102 F (38.9 C).  You develop abdominal discomfort.  You develop a headache.  You experience dizziness. FOR MORE INFORMATION National Institute of Neurological Disorders and Stroke: www.ninds.nih.gov American College of Rheumatology: www.rheumatology.org National Institute of Arthritis and Musculoskeletal and Skin Diseases: www.niams.nih.gov Document Released: 08/27/2002 Document Revised: 11/29/2011 Document Reviewed: 12/18/2009 [employer] Patient Information 2015 [employer]. This information is not intended to replace advice given to you by your health   care provider. Make sure  you discuss any questions you have with your health care provider.  

## 2015-04-04 LAB — PRENATAL PROFILE (SOLSTAS)
ANTIBODY SCREEN: NEGATIVE
BASOS ABS: 0 10*3/uL (ref 0.0–0.1)
Basophils Relative: 0 % (ref 0–1)
EOS ABS: 0.1 10*3/uL (ref 0.0–0.7)
Eosinophils Relative: 4 % (ref 0–5)
HEMATOCRIT: 36.6 % (ref 36.0–46.0)
HIV: NONREACTIVE
Hemoglobin: 12.1 g/dL (ref 12.0–15.0)
Hepatitis B Surface Ag: NEGATIVE
LYMPHS ABS: 1.2 10*3/uL (ref 0.7–4.0)
LYMPHS PCT: 34 % (ref 12–46)
MCH: 25.3 pg — ABNORMAL LOW (ref 26.0–34.0)
MCHC: 33.1 g/dL (ref 30.0–36.0)
MCV: 76.4 fL — ABNORMAL LOW (ref 78.0–100.0)
MONOS PCT: 6 % (ref 3–12)
MPV: 11 fL (ref 8.6–12.4)
Monocytes Absolute: 0.2 10*3/uL (ref 0.1–1.0)
NEUTROS ABS: 2 10*3/uL (ref 1.7–7.7)
Neutrophils Relative %: 56 % (ref 43–77)
PLATELETS: 130 10*3/uL — AB (ref 150–400)
RBC: 4.79 MIL/uL (ref 3.87–5.11)
RDW: 14.4 % (ref 11.5–15.5)
Rh Type: POSITIVE
Rubella: 1.54 Index — ABNORMAL HIGH (ref <0.90)
WBC: 3.6 10*3/uL — AB (ref 4.0–10.5)

## 2015-04-04 LAB — GLUCOSE TOLERANCE, 1 HOUR (50G) W/O FASTING: Glucose, 1 Hour GTT: 84 mg/dL (ref 70–140)

## 2015-04-05 LAB — PRESCRIPTION MONITORING PROFILE (19 PANEL)
Amphetamine/Meth: NEGATIVE ng/mL
BARBITURATE SCREEN, URINE: NEGATIVE ng/mL
Benzodiazepine Screen, Urine: NEGATIVE ng/mL
Buprenorphine, Urine: NEGATIVE ng/mL
COCAINE METABOLITES: NEGATIVE ng/mL
CREATININE, URINE: 302.21 mg/dL (ref >20.0)
Cannabinoid Scrn, Ur: NEGATIVE ng/mL
Carisoprodol, Urine: NEGATIVE ng/mL
FENTANYL URINE: NEGATIVE ng/mL
MDMA URINE: NEGATIVE ng/mL
Meperidine, Ur: NEGATIVE ng/mL
Methadone Screen, Urine: NEGATIVE ng/mL
Methaqualone: NEGATIVE ng/mL
Nitrites, Initial: NEGATIVE ug/mL
OXYCODONE SCRN UR: NEGATIVE ng/mL
Opiate Screen, Urine: NEGATIVE ng/mL
PHENCYCLIDINE, UR: NEGATIVE ng/mL
Propoxyphene: NEGATIVE ng/mL
Tapentadol, urine: NEGATIVE ng/mL
Tramadol Scrn, Ur: NEGATIVE ng/mL
ZOLPIDEM, URINE: NEGATIVE ng/mL
pH, Initial: 7.1 pH (ref 4.5–8.9)

## 2015-04-07 ENCOUNTER — Other Ambulatory Visit: Payer: Medicaid Other

## 2015-04-07 DIAGNOSIS — D573 Sickle-cell trait: Secondary | ICD-10-CM | POA: Insufficient documentation

## 2015-04-07 LAB — COMPREHENSIVE METABOLIC PANEL
ALT: 12 U/L (ref 0–35)
AST: 14 U/L (ref 0–37)
Albumin: 3.5 g/dL (ref 3.5–5.2)
Alkaline Phosphatase: 32 U/L — ABNORMAL LOW (ref 39–117)
BUN: 6 mg/dL (ref 6–23)
CALCIUM: 9 mg/dL (ref 8.4–10.5)
CO2: 23 mEq/L (ref 19–32)
CREATININE: 0.55 mg/dL (ref 0.50–1.10)
Chloride: 103 mEq/L (ref 96–112)
Glucose, Bld: 68 mg/dL — ABNORMAL LOW (ref 70–99)
Potassium: 3.9 mEq/L (ref 3.5–5.3)
Sodium: 136 mEq/L (ref 135–145)
TOTAL PROTEIN: 6.5 g/dL (ref 6.0–8.3)
Total Bilirubin: 0.3 mg/dL (ref 0.2–1.2)

## 2015-04-07 LAB — HEMOGLOBINOPATHY EVALUATION
HGB A2 QUANT: 3.3 % — AB (ref 2.2–3.2)
HGB S QUANTITAION: 33.8 % — AB
Hemoglobin Other: 0 %
Hgb A: 62.1 % — ABNORMAL LOW (ref 96.8–97.8)
Hgb F Quant: 0.8 % (ref 0.0–2.0)

## 2015-04-07 NOTE — Addendum Note (Signed)
Addended by: Sherre LainASH, Reya Aurich A on: 04/07/2015 12:48 PM   Modules accepted: Orders

## 2015-04-08 LAB — CBC
HEMATOCRIT: 37.1 % (ref 36.0–46.0)
Hemoglobin: 12.4 g/dL (ref 12.0–15.0)
MCH: 25.6 pg — AB (ref 26.0–34.0)
MCHC: 33.4 g/dL (ref 30.0–36.0)
MCV: 76.5 fL — ABNORMAL LOW (ref 78.0–100.0)
MPV: 12.3 fL (ref 8.6–12.4)
Platelets: 137 10*3/uL — ABNORMAL LOW (ref 150–400)
RBC: 4.85 MIL/uL (ref 3.87–5.11)
RDW: 14.3 % (ref 11.5–15.5)
WBC: 3.5 10*3/uL — ABNORMAL LOW (ref 4.0–10.5)

## 2015-04-09 LAB — PROTEIN, URINE, 24 HOUR
PROTEIN, URINE: 15 mg/dL (ref 5–24)
Protein, 24H Urine: 116 mg/d (ref <150)

## 2015-04-15 ENCOUNTER — Other Ambulatory Visit (HOSPITAL_COMMUNITY): Payer: Medicaid Other

## 2015-04-15 ENCOUNTER — Ambulatory Visit (HOSPITAL_COMMUNITY): Payer: Medicaid Other

## 2015-04-16 ENCOUNTER — Encounter (HOSPITAL_COMMUNITY): Payer: Self-pay

## 2015-04-16 ENCOUNTER — Ambulatory Visit (HOSPITAL_COMMUNITY)
Admission: RE | Admit: 2015-04-16 | Discharge: 2015-04-16 | Disposition: A | Discharge status: Home / Self Care | Payer: Medicaid Other | Source: Ambulatory Visit | Attending: Obstetrics & Gynecology | Admitting: Obstetrics & Gynecology

## 2015-04-16 ENCOUNTER — Other Ambulatory Visit: Payer: Self-pay | Admitting: General Practice

## 2015-04-16 DIAGNOSIS — Z3A00 Weeks of gestation of pregnancy not specified: Secondary | ICD-10-CM | POA: Diagnosis not present

## 2015-04-16 DIAGNOSIS — O0991 Supervision of high risk pregnancy, unspecified, first trimester: Secondary | ICD-10-CM

## 2015-04-16 DIAGNOSIS — Z3A13 13 weeks gestation of pregnancy: Secondary | ICD-10-CM | POA: Insufficient documentation

## 2015-04-16 DIAGNOSIS — L930 Discoid lupus erythematosus: Secondary | ICD-10-CM

## 2015-04-16 DIAGNOSIS — Z369 Encounter for antenatal screening, unspecified: Secondary | ICD-10-CM | POA: Insufficient documentation

## 2015-04-20 NOTE — Addendum Note (Signed)
Addended by: Adam Phenix on: 04/20/2015 03:49 PM   Modules accepted: Kipp Brood

## 2015-04-24 ENCOUNTER — Other Ambulatory Visit (HOSPITAL_COMMUNITY): Payer: Self-pay | Admitting: Obstetrics & Gynecology

## 2015-05-01 ENCOUNTER — Encounter (HOSPITAL_COMMUNITY): Payer: Self-pay | Admitting: *Deleted

## 2015-05-01 ENCOUNTER — Encounter: Payer: Self-pay | Admitting: Obstetrics & Gynecology

## 2015-05-01 ENCOUNTER — Ambulatory Visit (INDEPENDENT_AMBULATORY_CARE_PROVIDER_SITE_OTHER): Payer: Medicaid Other | Admitting: Family Medicine

## 2015-05-01 ENCOUNTER — Inpatient Hospital Stay (HOSPITAL_COMMUNITY)
Admission: AD | Admit: 2015-05-01 | Discharge: 2015-05-01 | Disposition: A | Discharge status: Home / Self Care | Payer: Medicaid Other | Source: Ambulatory Visit | Attending: Obstetrics & Gynecology | Admitting: Obstetrics & Gynecology

## 2015-05-01 VITALS — BP 102/63 | HR 81 | Temp 98.3°F | Wt 161.3 lb

## 2015-05-01 DIAGNOSIS — O210 Mild hyperemesis gravidarum: Secondary | ICD-10-CM | POA: Insufficient documentation

## 2015-05-01 DIAGNOSIS — O99612 Diseases of the digestive system complicating pregnancy, second trimester: Secondary | ICD-10-CM | POA: Diagnosis not present

## 2015-05-01 DIAGNOSIS — Z3A15 15 weeks gestation of pregnancy: Secondary | ICD-10-CM | POA: Diagnosis not present

## 2015-05-01 DIAGNOSIS — K219 Gastro-esophageal reflux disease without esophagitis: Secondary | ICD-10-CM

## 2015-05-01 DIAGNOSIS — O99112 Other diseases of the blood and blood-forming organs and certain disorders involving the immune mechanism complicating pregnancy, second trimester: Secondary | ICD-10-CM | POA: Diagnosis present

## 2015-05-01 DIAGNOSIS — D6862 Lupus anticoagulant syndrome: Secondary | ICD-10-CM | POA: Diagnosis not present

## 2015-05-01 DIAGNOSIS — E669 Obesity, unspecified: Secondary | ICD-10-CM

## 2015-05-01 DIAGNOSIS — M329 Systemic lupus erythematosus, unspecified: Secondary | ICD-10-CM

## 2015-05-01 DIAGNOSIS — Z9889 Other specified postprocedural states: Secondary | ICD-10-CM | POA: Diagnosis not present

## 2015-05-01 DIAGNOSIS — O0992 Supervision of high risk pregnancy, unspecified, second trimester: Secondary | ICD-10-CM

## 2015-05-01 DIAGNOSIS — O99212 Obesity complicating pregnancy, second trimester: Secondary | ICD-10-CM | POA: Diagnosis not present

## 2015-05-01 DIAGNOSIS — O99210 Obesity complicating pregnancy, unspecified trimester: Secondary | ICD-10-CM | POA: Insufficient documentation

## 2015-05-01 DIAGNOSIS — O219 Vomiting of pregnancy, unspecified: Secondary | ICD-10-CM | POA: Diagnosis not present

## 2015-05-01 DIAGNOSIS — O0991 Supervision of high risk pregnancy, unspecified, first trimester: Secondary | ICD-10-CM

## 2015-05-01 DIAGNOSIS — Z98891 History of uterine scar from previous surgery: Secondary | ICD-10-CM | POA: Insufficient documentation

## 2015-05-01 DIAGNOSIS — R1010 Upper abdominal pain, unspecified: Secondary | ICD-10-CM | POA: Insufficient documentation

## 2015-05-01 LAB — POCT URINALYSIS DIP (DEVICE)
BILIRUBIN URINE: NEGATIVE
Glucose, UA: NEGATIVE mg/dL
Ketones, ur: NEGATIVE mg/dL
Nitrite: NEGATIVE
PH: 5.5 (ref 5.0–8.0)
Protein, ur: NEGATIVE mg/dL
Urobilinogen, UA: 0.2 mg/dL (ref 0.0–1.0)

## 2015-05-01 LAB — URINALYSIS, ROUTINE W REFLEX MICROSCOPIC
BILIRUBIN URINE: NEGATIVE
Glucose, UA: NEGATIVE mg/dL
KETONES UR: 40 mg/dL — AB
Nitrite: NEGATIVE
PROTEIN: NEGATIVE mg/dL
Specific Gravity, Urine: >1.03 — ABNORMAL HIGH (ref 1.005–1.030)
Urobilinogen, UA: 0.2 mg/dL (ref 0.0–1.0)
pH: 6 (ref 5.0–8.0)

## 2015-05-01 LAB — URINE MICROSCOPIC-ADD ON

## 2015-05-01 MED ORDER — ESOMEPRAZOLE MAGNESIUM 20 MG PO CPDR: 20.0000 mg | DELAYED_RELEASE_CAPSULE | Freq: Every day | ORAL | Status: DC

## 2015-05-01 MED ORDER — PROMETHAZINE HCL 25 MG PO TABS: 12.5000 mg | ORAL_TABLET | Freq: Four times a day (QID) | ORAL | Status: DC | PRN

## 2015-05-01 MED ORDER — GI COCKTAIL ~~LOC~~
30.0000 mL | Freq: Once | ORAL | Status: AC
  Administered 2015-05-01: 30 mL via ORAL
  Filled 2015-05-01: qty 30

## 2015-05-01 MED ORDER — PROMETHAZINE HCL 25 MG/ML IJ SOLN
25.0000 mg | Freq: Once | INTRAMUSCULAR | Status: AC
  Administered 2015-05-01: 25 mg via INTRAMUSCULAR
  Filled 2015-05-01: qty 1

## 2015-05-01 NOTE — MAU Note (Signed)
Pt states that upper stomach pain began around 1 hour ago and on the way here she vomited. She had a beef patty prior to the pain beginning. States she has not felt like herself today. Also has some constant lower abdominal pain that has been going on for 3 days. Denies vag bleeding or abnormal discharge.

## 2015-05-01 NOTE — MAU Provider Note (Signed)
History     CSN: [id number]  Arrival date and time: 05/01/15 1910   First Provider Initiated Contact with Patient 05/01/15 2152      No chief complaint on file.  HPI Comments: Anne Mcdowell is a 29 y.o. [phone] at [redacted]w[redacted]d who presents today with upper abdominal pain. She states that the pain started a couple of hours after eating. She started to have nausea and vomiting and then a sharp intermittent pain in the upper abdomen. She has had her gallbladder removed. Patient reports a history of acid reflux. She states that she has been on Nexium in the past, and that has worked well for her.   Abdominal Pain This is a new problem. The current episode started today. The onset quality is sudden. The problem occurs intermittently. The problem has been unchanged. The pain is located in the epigastric region. The pain is at a severity of 7/10. The quality of the pain is colicky and sharp. The abdominal pain does not radiate. Associated symptoms include nausea and vomiting. Pertinent negatives include no constipation, diarrhea, dysuria, fever or frequency. The pain is aggravated by eating. The pain is relieved by nothing. She has tried antacids for the symptoms. The treatment provided no relief.     Past Medical History  Diagnosis Date  . Lupus   . Genital warts     Past Surgical History  Procedure Laterality Date  . Cholecystectomy    . Cesarean section    . Elective abortion      No family history on file.  Social History  Substance Use Topics  . Smoking status: Never Smoker   . Smokeless tobacco: Never Used  . Alcohol Use: No    Allergies:  Allergies  Allergen Reactions  . Penicillins Hives and Swelling  . Vancomycin Hives and Swelling    Prescriptions prior to admission  Medication Sig Dispense Refill Last Dose  . calcium carbonate (TUMS - DOSED IN MG ELEMENTAL CALCIUM) 500 MG chewable tablet Chew 2 tablets by mouth daily as needed for indigestion or heartburn.   Past Week  at Unknown time  . citalopram (CELEXA) 20 MG tablet Take 20 mg by mouth at bedtime.    04/30/2015 at Unknown time  . hydroxychloroquine (PLAQUENIL) 200 MG tablet TAKE TWO TABLETS BY MOUTH AT BEDTIME   04/30/2015 at Unknown time  . aspirin EC 81 MG tablet Take 1 tablet (81 mg total) by mouth daily. (Patient not taking: Reported on 04/16/2015) 100 tablet 3 Not Taking  . promethazine (PHENERGAN) 25 MG tablet Take 1 tablet (25 mg total) by mouth every 6 (six) hours as needed for nausea or vomiting. (Patient not taking: Reported on 04/16/2015) 30 tablet 0 Not Taking at Unknown time  . scopolamine (TRANSDERM-SCOP, 1.5 MG,) 1 MG/3DAYS Place 1 patch (1.5 mg total) onto the skin every 3 (three) days. (Patient not taking: Reported on 04/16/2015) 10 patch 12 Not Taking at Unknown time    Review of Systems  Constitutional: Negative for fever.  Gastrointestinal: Positive for nausea, vomiting and abdominal pain. Negative for diarrhea and constipation.  Genitourinary: Negative for dysuria, urgency and frequency.   Physical Exam   Blood pressure 120/65, pulse 85, temperature 98.4 F (36.9 C), temperature source Oral, resp. rate 18, height 4\' 11"  (1.499 m), weight 73.664 kg (162 lb 6.4 oz), last menstrual period 02/04/2015, SpO2 100 %.  Physical Exam  Nursing note and vitals reviewed. Constitutional: She is oriented to person, place, and time. She appears well-developed and  well-nourished. No distress.  HENT:  Head: Normocephalic.  Cardiovascular: Normal rate.   Respiratory: Effort normal.  GI: Soft. There is no tenderness. There is no rebound.  Neurological: She is alert and oriented to person, place, and time.  Skin: Skin is warm and dry.  Psychiatric: She has a normal mood and affect.   Results for orders placed or performed during the hospital encounter of 05/01/15 (from the past 24 hour(s))  Urinalysis, Routine w reflex microscopic (not at [facility])     Status: Abnormal   Collection Time: 05/01/15  9:20  PM  Result Value Ref Range   Color, Urine YELLOW YELLOW   APPearance CLEAR CLEAR   Specific Gravity, Urine >1.030 (H) 1.005 - 1.030   pH 6.0 5.0 - 8.0   Glucose, UA NEGATIVE NEGATIVE mg/dL   Hgb urine dipstick MODERATE (A) NEGATIVE   Bilirubin Urine NEGATIVE NEGATIVE   Ketones, ur 40 (A) NEGATIVE mg/dL   Protein, ur NEGATIVE NEGATIVE mg/dL   Urobilinogen, UA 0.2 0.0 - 1.0 mg/dL   Nitrite NEGATIVE NEGATIVE   Leukocytes, UA SMALL (A) NEGATIVE  Urine microscopic-add on     Status: Abnormal   Collection Time: 05/01/15  9:20 PM  Result Value Ref Range   Squamous Epithelial / LPF FEW (A) RARE   WBC, UA 3-6 <3 WBC/hpf   RBC / HPF 7-10 <3 RBC/hpf   Bacteria, UA FEW (A) RARE     MAU Course  Procedures  MDM 2245: Patient has had phenergan and GI cocktail. She reports that she is feeling better.   Assessment and Plan   1. Obesity affecting pregnancy, antepartum, second trimester   2. Gastroesophageal reflux disease without esophagitis   3. Nausea/vomiting in pregnancy    DC home Comfort measures reviewed  2nd Trimester precautions  RX: nexium 20 mg q day #30, phenergan PRN #30  Return to MAU as needed FU with OB as planned  Follow-up Information    Follow up with [facility].   Specialty:  Obstetrics and Gynecology   Why:  As scheduled   Contact information:   [address] [place] [address] [phone]       Tawnya Crook 05/01/2015, 10:00 PM

## 2015-05-01 NOTE — Discharge Instructions (Signed)
Second Trimester of Pregnancy °The second trimester is from week 13 through week 28, months 4 through 6. The second trimester is often a time when you feel your best. Your body has also adjusted to being pregnant, and you begin to feel better physically. Usually, morning sickness has lessened or quit completely, you may have more energy, and you may have an increase in appetite. The second trimester is also a time when the fetus is growing rapidly. At the end of the sixth month, the fetus is about 9 inches long and weighs about 1½ pounds. You will likely begin to feel the baby move (quickening) between 18 and 20 weeks of the pregnancy. °BODY CHANGES °Your body goes through many changes during pregnancy. The changes vary from woman to woman.  °· Your weight will continue to increase. You will notice your lower abdomen bulging out. °· You may begin to get stretch marks on your hips, abdomen, and breasts. °· You may develop headaches that can be relieved by medicines approved by your health care provider. °· You may urinate more often because the fetus is pressing on your bladder. °· You may develop or continue to have heartburn as a result of your pregnancy. °· You may develop constipation because certain hormones are causing the muscles that push waste through your intestines to slow down. °· You may develop hemorrhoids or swollen, bulging veins (varicose veins). °· You may have back pain because of the weight gain and pregnancy hormones relaxing your joints between the bones in your pelvis and as a result of a shift in weight and the muscles that support your balance. °· Your breasts will continue to grow and be tender. °· Your gums may bleed and may be sensitive to brushing and flossing. °· Dark spots or blotches (chloasma, mask of pregnancy) may develop on your face. This will likely fade after the baby is born. °· A dark line from your belly button to the pubic area (linea nigra) may appear. This will likely fade  after the baby is born. °· You may have changes in your hair. These can include thickening of your hair, rapid growth, and changes in texture. Some women also have hair loss during or after pregnancy, or hair that feels dry or thin. Your hair will most likely return to normal after your baby is born. °WHAT TO EXPECT AT YOUR PRENATAL VISITS °During a routine prenatal visit: °· You will be weighed to make sure you and the fetus are growing normally. °· Your blood pressure will be taken. °· Your abdomen will be measured to track your baby's growth. °· The fetal heartbeat will be listened to. °· Any test results from the previous visit will be discussed. °Your health care provider may ask you: °· How you are feeling. °· If you are feeling the baby move. °· If you have had any abnormal symptoms, such as leaking fluid, bleeding, severe headaches, or abdominal cramping. °· If you have any questions. °Other tests that may be performed during your second trimester include: °· Blood tests that check for: °¨ Low iron levels (anemia). °¨ Gestational diabetes (between 24 and 28 weeks). °¨ Rh antibodies. °· Urine tests to check for infections, diabetes, or protein in the urine. °· An ultrasound to confirm the proper growth and development of the baby. °· An amniocentesis to check for possible genetic problems. °· Fetal screens for spina bifida and Down syndrome. °HOME CARE INSTRUCTIONS  °· Avoid all smoking, herbs, alcohol, and unprescribed   drugs. These chemicals affect the formation and growth of the baby. °· Follow your health care provider's instructions regarding medicine use. There are medicines that are either safe or unsafe to take during pregnancy. °· Exercise only as directed by your health care provider. Experiencing uterine cramps is a good sign to stop exercising. °· Continue to eat regular, healthy meals. °· Wear a good support bra for breast tenderness. °· Do not use hot tubs, steam rooms, or saunas. °· Wear your  seat belt at all times when driving. °· Avoid raw meat, uncooked cheese, cat litter boxes, and soil used by cats. These carry germs that can cause birth defects in the baby. °· Take your prenatal vitamins. °· Try taking a stool softener (if your health care provider approves) if you develop constipation. Eat more high-fiber foods, such as fresh vegetables or fruit and whole grains. Drink plenty of fluids to keep your urine clear or pale yellow. °· Take warm sitz baths to soothe any pain or discomfort caused by hemorrhoids. Use hemorrhoid cream if your health care provider approves. °· If you develop varicose veins, wear support hose. Elevate your feet for 15 minutes, 3-4 times a day. Limit salt in your diet. °· Avoid heavy lifting, wear low heel shoes, and practice good posture. °· Rest with your legs elevated if you have leg cramps or low back pain. °· Visit your dentist if you have not gone yet during your pregnancy. Use a soft toothbrush to brush your teeth and be gentle when you floss. °· A sexual relationship may be continued unless your health care provider directs you otherwise. °· Continue to go to all your prenatal visits as directed by your health care provider. °SEEK MEDICAL CARE IF:  °· You have dizziness. °· You have mild pelvic cramps, pelvic pressure, or nagging pain in the abdominal area. °· You have persistent nausea, vomiting, or diarrhea. °· You have a bad smelling vaginal discharge. °· You have pain with urination. °SEEK IMMEDIATE MEDICAL CARE IF:  °· You have a fever. °· You are leaking fluid from your vagina. °· You have spotting or bleeding from your vagina. °· You have severe abdominal cramping or pain. °· You have rapid weight gain or loss. °· You have shortness of breath with chest pain. °· You notice sudden or extreme swelling of your face, hands, ankles, feet, or legs. °· You have not felt your baby move in over an hour. °· You have severe headaches that do not go away with  medicine. °· You have vision changes. °Document Released: 08/31/2001 Document Revised: 09/11/2013 Document Reviewed: 11/07/2012 °ExitCare® Patient Information ©2015 [employer]. This information is not intended to replace advice given to you by your health care provider. Make sure you discuss any questions you have with your health care provider. ° °Round Ligament Pain During Pregnancy °Round ligament pain is a sharp pain or jabbing feeling often felt in the lower belly or groin area on one or both sides. It is one of the most common complaints during pregnancy and is considered a normal part of pregnancy. It is most often felt during the second trimester. ° °Here is what you need to know about round ligament pain, including some tips to help you feel better. ° °Causes of Round Ligament Pain ° °Several thick ligaments surround and support your womb (uterus) as it grows during pregnancy. One of them is called the round ligament. ° °The round ligament connects the front part of the womb to   your groin, the area where your legs attach to your pelvis. The round ligament normally tightens and relaxes slowly. ° °As your baby and womb grow, the round ligament stretches. That makes it more likely to become strained. ° °Sudden movements can cause the ligament to tighten quickly, like a rubber band snapping. This causes a sudden and quick jabbing feeling. ° °Symptoms of Round Ligament Pain ° °Round ligament pain can be concerning and uncomfortable. But it is considered normal as your body changes during pregnancy. ° °The symptoms of round ligament pain include a sharp, sudden spasm in the belly. It usually affects the right side, but it may happen on both sides. The pain only lasts a few seconds. ° °Exercise may cause the pain, as will rapid movements such as: ° °sneezing °coughing °laughing °rolling over in bed °standing up too quickly ° °Treatment of Round Ligament Pain ° °Here are some tips that may help reduce your  discomfort: ° °Pain relief. Take over-the-counter acetaminophen for pain, if necessary. Ask your doctor if this is OK. ° °Exercise. Get plenty of exercise to keep your stomach (core) muscles strong. Doing stretching exercises or prenatal yoga can be helpful. Ask your doctor which exercises are safe for you and your baby. ° °A helpful exercise involves putting your hands and knees on the floor, lowering your head, and pushing your backside into the air. ° °Avoid sudden movements. Change positions slowly (such as standing up or sitting down) to avoid sudden movements that may cause stretching and pain. ° °Flex your hips. Bend and flex your hips before you cough, sneeze, or laugh to avoid pulling on the ligaments. ° °Apply warmth. A heating pad or warm bath may be helpful. Ask your doctor if this is OK. Extreme heat can be dangerous to the baby. ° °You should try to modify your daily activity level and avoid positions that may worsen the condition. ° °When to Call the Doctor/Midwife ° °Always tell your doctor or midwife about any type of pain you have during pregnancy. Round ligament pain is quick and doesn't last long. ° °Call your health care provider immediately if you have: ° °severe pain °fever °chills °pain on urination °difficulty walking ° °Belly pain during pregnancy can be due to many different causes. It is important for your doctor to rule out more serious conditions, including pregnancy complications such as placenta abruption or non-pregnancy illnesses such as: ° °inguinal hernia °appendicitis °stomach, liver, and kidney problems °Preterm labor pains may sometimes be mistaken for round ligament pain. ° °

## 2015-05-01 NOTE — Progress Notes (Signed)
Oxygen saturation 100%.  Medicaid home form completed

## 2015-05-01 NOTE — MAU Note (Addendum)
Pt was seen in clinic this morning and was feeling fine. She started vomiting at work today and also had abdominal pain at that time. Pt reports that pain in her upper abdomen 3 hours after she vomited. Pt states that her lower abdominal pain has been present for 3 days. Pt denies change in vaginal discharge or bleeding. Pt denies vaginal bleeding. Denies pain with urination.

## 2015-05-01 NOTE — Patient Instructions (Signed)
Second Trimester of Pregnancy The second trimester is from week 13 through week 28, months 4 through 6. The second trimester is often a time when you feel your best. Your body has also adjusted to being pregnant, and you begin to feel better physically. Usually, morning sickness has lessened or quit completely, you may have more energy, and you may have an increase in appetite. The second trimester is also a time when the fetus is growing rapidly. At the end of the sixth month, the fetus is about 9 inches long and weighs about 1 pounds. You will likely begin to feel the baby move (quickening) between 18 and 20 weeks of the pregnancy. BODY CHANGES Your body goes through many changes during pregnancy. The changes vary from woman to woman.   Your weight will continue to increase. You will notice your lower abdomen bulging out.  You may begin to get stretch marks on your hips, abdomen, and breasts.  You may develop headaches that can be relieved by medicines approved by your health care provider.  You may urinate more often because the fetus is pressing on your bladder.  You may develop or continue to have heartburn as a result of your pregnancy.  You may develop constipation because certain hormones are causing the muscles that push waste through your intestines to slow down.  You may develop hemorrhoids or swollen, bulging veins (varicose veins).  You may have back pain because of the weight gain and pregnancy hormones relaxing your joints between the bones in your pelvis and as a result of a shift in weight and the muscles that support your balance.  Your breasts will continue to grow and be tender.  Your gums may bleed and may be sensitive to brushing and flossing.  Dark spots or blotches (chloasma, mask of pregnancy) may develop on your face. This will likely fade after the baby is born.  A dark line from your belly button to the pubic area (linea nigra) may appear. This will likely fade  after the baby is born.  You may have changes in your hair. These can include thickening of your hair, rapid growth, and changes in texture. Some women also have hair loss during or after pregnancy, or hair that feels dry or thin. Your hair will most likely return to normal after your baby is born. WHAT TO EXPECT AT YOUR PRENATAL VISITS During a routine prenatal visit:  You will be weighed to make sure you and the fetus are growing normally.  Your blood pressure will be taken.  Your abdomen will be measured to track your baby's growth.  The fetal heartbeat will be listened to.  Any test results from the previous visit will be discussed. Your health care provider may ask you:  How you are feeling.  If you are feeling the baby move.  If you have had any abnormal symptoms, such as leaking fluid, bleeding, severe headaches, or abdominal cramping.  If you have any questions. Other tests that may be performed during your second trimester include:  Blood tests that check for:  Low iron levels (anemia).  Gestational diabetes (between 24 and 28 weeks).  Rh antibodies.  Urine tests to check for infections, diabetes, or protein in the urine.  An ultrasound to confirm the proper growth and development of the baby.  An amniocentesis to check for possible genetic problems.  Fetal screens for spina bifida and Down syndrome. HOME CARE INSTRUCTIONS   Avoid all smoking, herbs, alcohol, and unprescribed   drugs. These chemicals affect the formation and growth of the baby.  Follow your health care provider's instructions regarding medicine use. There are medicines that are either safe or unsafe to take during pregnancy.  Exercise only as directed by your health care provider. Experiencing uterine cramps is a good sign to stop exercising.  Continue to eat regular, healthy meals.  Wear a good support bra for breast tenderness.  Do not use hot tubs, steam rooms, or saunas.  Wear your  seat belt at all times when driving.  Avoid raw meat, uncooked cheese, cat litter boxes, and soil used by cats. These carry germs that can cause birth defects in the baby.  Take your prenatal vitamins.  Try taking a stool softener (if your health care provider approves) if you develop constipation. Eat more high-fiber foods, such as fresh vegetables or fruit and whole grains. Drink plenty of fluids to keep your urine clear or pale yellow.  Take warm sitz baths to soothe any pain or discomfort caused by hemorrhoids. Use hemorrhoid cream if your health care provider approves.  If you develop varicose veins, wear support hose. Elevate your feet for 15 minutes, 3-4 times a day. Limit salt in your diet.  Avoid heavy lifting, wear low heel shoes, and practice good posture.  Rest with your legs elevated if you have leg cramps or low back pain.  Visit your dentist if you have not gone yet during your pregnancy. Use a soft toothbrush to brush your teeth and be gentle when you floss.  A sexual relationship may be continued unless your health care provider directs you otherwise.  Continue to go to all your prenatal visits as directed by your health care provider. SEEK MEDICAL CARE IF:   You have dizziness.  You have mild pelvic cramps, pelvic pressure, or nagging pain in the abdominal area.  You have persistent nausea, vomiting, or diarrhea.  You have a bad smelling vaginal discharge.  You have pain with urination. SEEK IMMEDIATE MEDICAL CARE IF:   You have a fever.  You are leaking fluid from your vagina.  You have spotting or bleeding from your vagina.  You have severe abdominal cramping or pain.  You have rapid weight gain or loss.  You have shortness of breath with chest pain.  You notice sudden or extreme swelling of your face, hands, ankles, feet, or legs.  You have not felt your baby move in over an hour.  You have severe headaches that do not go away with  medicine.  You have vision changes. Document Released: 08/31/2001 Document Revised: 09/11/2013 Document Reviewed: 11/07/2012 ExitCare Patient Information 2015 [employer]. This information is not intended to replace advice given to you by your health care provider. Make sure you discuss any questions you have with your health care provider.  

## 2015-05-01 NOTE — Progress Notes (Signed)
Subjective:  Anne Mcdowell is a 29 y.o. [phone] at [redacted]w[redacted]d being seen today for ongoing prenatal care.  Patient reports no complaints.  Contractions: Not present.  Vag. Bleeding: None. Movement: Absent. Denies leaking of fluid.   The following portions of the patient's history were reviewed and updated as appropriate: allergies, current medications, past family history, past medical history, past social history, past surgical history and problem list.   Objective:   Filed Vitals:   05/01/15 1043  BP: 102/63  Pulse: 81  Temp: 98.3 F (36.8 C)  Weight: 161 lb 4.8 oz (73.165 kg)    Fetal Status: Fetal Heart Rate (bpm): 145   Movement: Absent     General:  Alert, oriented and cooperative. Patient is in no acute distress.  Skin: Skin is warm and dry. No rash noted.   Cardiovascular: Normal heart rate noted  Respiratory: Normal respiratory effort, no problems with respiration noted  Abdomen: Soft, gravid, appropriate for gestational age. Pain/Pressure: Present     Pelvic: Vag. Bleeding: None     Cervical exam deferred        Extremities: Normal range of motion.  Edema: None  Mental Status: Normal mood and affect. Normal behavior. Normal judgment and thought content.   Urinalysis: Urine Protein: Negative Urine Glucose: Negative  Assessment and Plan:  Pregnancy: G5P1031 at [redacted]w[redacted]d  1. Supervision of high risk pregnancy, antepartum, first trimester FHT normal - [place] MFM OF COMP + 14 WK; Future  2. Disseminated lupus erythematosus Continue plaquenil.  Controlled  3. Previous cesarean section Thinking about TOLAC  Preterm labor symptoms and general obstetric precautions including but not limited to vaginal bleeding, contractions, leaking of fluid and fetal movement were reviewed in detail with the patient. Please refer to After Visit Summary for other counseling recommendations.  Return in about 4 weeks (around 05/29/2015).   [name], DO

## 2015-05-29 ENCOUNTER — Encounter (HOSPITAL_COMMUNITY): Payer: Self-pay

## 2015-05-29 ENCOUNTER — Ambulatory Visit (INDEPENDENT_AMBULATORY_CARE_PROVIDER_SITE_OTHER): Payer: Medicaid Other | Admitting: Obstetrics & Gynecology

## 2015-05-29 ENCOUNTER — Other Ambulatory Visit: Payer: Self-pay | Admitting: Family Medicine

## 2015-05-29 ENCOUNTER — Ambulatory Visit (HOSPITAL_COMMUNITY)
Admission: RE | Admit: 2015-05-29 | Discharge: 2015-05-29 | Disposition: A | Discharge status: Home / Self Care | Payer: Medicaid Other | Source: Ambulatory Visit | Attending: Family Medicine | Admitting: Family Medicine

## 2015-05-29 ENCOUNTER — Encounter: Payer: Self-pay | Admitting: Obstetrics & Gynecology

## 2015-05-29 VITALS — BP 112/67 | HR 80 | Temp 98.6°F | Wt 162.3 lb

## 2015-05-29 VITALS — BP 123/72 | HR 94 | Wt 163.0 lb

## 2015-05-29 DIAGNOSIS — Z3A19 19 weeks gestation of pregnancy: Secondary | ICD-10-CM

## 2015-05-29 DIAGNOSIS — O0992 Supervision of high risk pregnancy, unspecified, second trimester: Secondary | ICD-10-CM

## 2015-05-29 DIAGNOSIS — O34219 Maternal care for unspecified type scar from previous cesarean delivery: Secondary | ICD-10-CM

## 2015-05-29 DIAGNOSIS — M329 Systemic lupus erythematosus, unspecified: Secondary | ICD-10-CM

## 2015-05-29 DIAGNOSIS — O0991 Supervision of high risk pregnancy, unspecified, first trimester: Secondary | ICD-10-CM

## 2015-05-29 DIAGNOSIS — O3421 Maternal care for scar from previous cesarean delivery: Secondary | ICD-10-CM | POA: Insufficient documentation

## 2015-05-29 DIAGNOSIS — L930 Discoid lupus erythematosus: Secondary | ICD-10-CM

## 2015-05-29 LAB — POCT URINALYSIS DIP (DEVICE)
Bilirubin Urine: NEGATIVE
GLUCOSE, UA: NEGATIVE mg/dL
Ketones, ur: NEGATIVE mg/dL
LEUKOCYTES UA: NEGATIVE
Nitrite: NEGATIVE
Protein, ur: NEGATIVE mg/dL
Specific Gravity, Urine: 1.025 (ref 1.005–1.030)
UROBILINOGEN UA: 1 mg/dL (ref 0.0–1.0)
pH: 7 (ref 5.0–8.0)

## 2015-05-29 NOTE — Patient Instructions (Signed)
Vaginal Birth After Cesarean Delivery Vaginal birth after cesarean delivery (VBAC) is giving birth vaginally after previously delivering a baby by a cesarean. In the past, if a woman had a cesarean delivery, all births afterward would be done by cesarean delivery. This is no longer true. It can be safe for the mother to try a vaginal delivery after having a cesarean delivery.  It is important to discuss VBAC with your health care provider early in the pregnancy so you can understand the risks, benefits, and options. It will give you time to decide what is best in your particular case. The final decision about whether to have a VBAC or repeat cesarean delivery should be between you and your health care provider. Any changes in your health or your baby's health during your pregnancy may make it necessary to change your initial decision about VBAC.  WOMEN WHO PLAN TO HAVE A VBAC SHOULD CHECK WITH THEIR HEALTH CARE PROVIDER TO BE SURE THAT:  The previous cesarean delivery was done with a low transverse uterine cut (incision) (not a vertical classical incision).   The birth canal is big enough for the baby.   There were no other operations on the uterus.   An electronic fetal monitor (EFM) will be on at all times during labor.   An operating room will be available and ready in case an emergency cesarean delivery is needed.   A health care provider and surgical nursing staff will be available at all times during labor to be ready to do an emergency delivery cesarean if necessary.   An anesthesiologist will be present in case an emergency cesarean delivery is needed.   The nursery is prepared and has adequate personnel and necessary equipment available to care for the baby in case of an emergency cesarean delivery. BENEFITS OF VBAC  Shorter stay in the hospital.   Avoidance of risks associated with cesarean delivery, such as:  Surgical complications, such as opening of the incision or  hernia in the incision.  Injury to other organs.  Fever. This can occur if an infection develops after surgery. It can also occur as a reaction to the medicine given to make you numb during the surgery.  Less blood loss and need for blood transfusions.  Lower risk of blood clots and infection.  Shorter recovery.   Decreased risk for having to remove the uterus (hysterectomy).   Decreased risk for the placenta to completely or partially cover the opening of the uterus (placenta previa) with a future pregnancy.   Decrease risk in future labor and delivery. RISKS OF A VBAC  Tearing (rupture) of the uterus. This is occurs in less than 1% of VBACs. The risk of this happening is higher if:  Steps are taken to begin the labor process (induce labor) or stimulate or strengthen contractions (augment labor).   Medicine is used to soften (ripen) the cervix.  Having to remove the uterus (hysterectomy) if it ruptures. VBAC SHOULD NOT BE DONE IF:  The previous cesarean delivery was done with a vertical (classical) or T-shaped incision or you do not know what kind of incision was made.   You had a ruptured uterus.   You have had certain types of surgery on your uterus, such as removal of uterine fibroids. Ask your health care provider about other types of surgeries that prevent you from having a VBAC.  You have certain medical or childbirth (obstetrical) problems.   There are problems with the baby.   You   have had two previous cesarean deliveries and no vaginal deliveries. OTHER FACTS TO KNOW ABOUT VBAC:  It is safe to have an epidural anesthetic with VBAC.   It is safe to turn the baby from a breech position (attempt an external cephalic version).   It is safe to try a VBAC with twins.   VBAC may not be successful if your baby weights 8.8 lb (4 kg) or more. However, weight predictions are not always accurate and should not be used alone to decide if VBAC is right for  you.  There is an increased failure rate if the time between the cesarean delivery and VBAC is less than 19 months.   Your health care provider may advise against a VBAC if you have preeclampsia (high blood pressure, protein in the urine, and swelling of face and extremities).   VBAC is often successful if you previously gave birth vaginally.   VBAC is often successful when the labor starts spontaneously before the due date.   Delivering a baby through a VBAC is similar to having a normal spontaneous vaginal delivery. Document Released: 02/27/2007 Document Revised: 01/21/2014 Document Reviewed: 04/05/2013 Exit[employer] Patient Information 2015 [employer]. This information is not intended to replace advice given to you by your health care provider. Make sure you discuss any questions you have with your health care provider.  

## 2015-05-29 NOTE — Progress Notes (Signed)
Subjective:US was today  Anne Mcdowell is a 29 y.o. [phone] at [redacted]w[redacted]d being seen today for ongoing prenatal care.  Patient reports pelvic pain.  Contractions: Not present.  Vag. Bleeding: None. Movement: Absent. Denies leaking of fluid.   The following portions of the patient's history were reviewed and updated as appropriate: allergies, current medications, past family history, past medical history, past social history, past surgical history and problem list.   Objective:   Filed Vitals:   05/29/15 1056  BP: 112/67  Pulse: 80  Temp: 98.6 F (37 C)  Weight: 162 lb 4.8 oz (73.619 kg)    Fetal Status: Fetal Heart Rate (bpm): 150   Movement: Absent     General:  Alert, oriented and cooperative. Patient is in no acute distress.  Skin: Skin is warm and dry. No rash noted.   Cardiovascular: Normal heart rate noted  Respiratory: Normal respiratory effort, no problems with respiration noted  Abdomen: Soft, gravid, appropriate for gestational age. Pain/Pressure: Present     Pelvic: Vag. Bleeding: None     Cervical exam deferred        Extremities: Normal range of motion.     Mental Status: Normal mood and affect. Normal behavior. Normal judgment and thought content.   Urinalysis:      Assessment and Plan:  Pregnancy: G5P0131 at [redacted]w[redacted]d  1. Supervision of high risk pregnancy, antepartum, second trimester Nl growth on [place]  Preterm labor symptoms and general obstetric precautions including but not limited to vaginal bleeding, contractions, leaking of fluid and fetal movement were reviewed in detail with the patient. Please refer to After Visit Summary for other counseling recommendations.  4 weeks VBAC in formation given Adam Phenix, MD

## 2015-05-30 ENCOUNTER — Other Ambulatory Visit: Payer: Self-pay | Admitting: Advanced Practice Midwife

## 2015-06-12 ENCOUNTER — Other Ambulatory Visit: Payer: Self-pay | Admitting: Advanced Practice Midwife

## 2015-06-23 ENCOUNTER — Other Ambulatory Visit: Payer: Self-pay | Admitting: Obstetrics & Gynecology

## 2015-06-26 ENCOUNTER — Encounter (HOSPITAL_COMMUNITY): Payer: Self-pay | Admitting: Obstetrics

## 2015-06-26 ENCOUNTER — Inpatient Hospital Stay (HOSPITAL_COMMUNITY)
Admission: AD | Admit: 2015-06-26 | Discharge: 2015-06-28 | DRG: 781 | Disposition: A | Discharge status: Home / Self Care | Payer: Medicaid Other | Source: Ambulatory Visit | Attending: Family Medicine | Admitting: Family Medicine

## 2015-06-26 ENCOUNTER — Ambulatory Visit (INDEPENDENT_AMBULATORY_CARE_PROVIDER_SITE_OTHER): Payer: Medicaid Other | Admitting: Family

## 2015-06-26 ENCOUNTER — Other Ambulatory Visit (HOSPITAL_COMMUNITY): Payer: Self-pay | Admitting: Maternal and Fetal Medicine

## 2015-06-26 ENCOUNTER — Ambulatory Visit (HOSPITAL_COMMUNITY)
Admission: RE | Admit: 2015-06-26 | Discharge: 2015-06-26 | Disposition: A | Discharge status: Home / Self Care | Payer: Medicaid Other | Source: Ambulatory Visit | Attending: Family Medicine | Admitting: Family Medicine

## 2015-06-26 ENCOUNTER — Encounter (HOSPITAL_COMMUNITY): Payer: Self-pay

## 2015-06-26 ENCOUNTER — Encounter (HOSPITAL_COMMUNITY): Payer: Self-pay | Admitting: Anesthesiology

## 2015-06-26 VITALS — BP 121/72 | HR 99 | Temp 99.2°F | Wt 163.5 lb

## 2015-06-26 DIAGNOSIS — O0992 Supervision of high risk pregnancy, unspecified, second trimester: Secondary | ICD-10-CM | POA: Diagnosis present

## 2015-06-26 DIAGNOSIS — R8271 Bacteriuria: Secondary | ICD-10-CM | POA: Diagnosis not present

## 2015-06-26 DIAGNOSIS — O99112 Other diseases of the blood and blood-forming organs and certain disorders involving the immune mechanism complicating pregnancy, second trimester: Secondary | ICD-10-CM | POA: Diagnosis not present

## 2015-06-26 DIAGNOSIS — O99612 Diseases of the digestive system complicating pregnancy, second trimester: Secondary | ICD-10-CM | POA: Diagnosis present

## 2015-06-26 DIAGNOSIS — D573 Sickle-cell trait: Secondary | ICD-10-CM | POA: Diagnosis present

## 2015-06-26 DIAGNOSIS — O99012 Anemia complicating pregnancy, second trimester: Secondary | ICD-10-CM | POA: Diagnosis present

## 2015-06-26 DIAGNOSIS — O34219 Maternal care for unspecified type scar from previous cesarean delivery: Secondary | ICD-10-CM

## 2015-06-26 DIAGNOSIS — O3432 Maternal care for cervical incompetence, second trimester: Secondary | ICD-10-CM

## 2015-06-26 DIAGNOSIS — Z98891 History of uterine scar from previous surgery: Secondary | ICD-10-CM

## 2015-06-26 DIAGNOSIS — O99212 Obesity complicating pregnancy, second trimester: Secondary | ICD-10-CM

## 2015-06-26 DIAGNOSIS — O9989 Other specified diseases and conditions complicating pregnancy, childbirth and the puerperium: Secondary | ICD-10-CM | POA: Diagnosis present

## 2015-06-26 DIAGNOSIS — K219 Gastro-esophageal reflux disease without esophagitis: Secondary | ICD-10-CM | POA: Diagnosis present

## 2015-06-26 DIAGNOSIS — Z23 Encounter for immunization: Secondary | ICD-10-CM

## 2015-06-26 DIAGNOSIS — Z3A23 23 weeks gestation of pregnancy: Secondary | ICD-10-CM

## 2015-06-26 DIAGNOSIS — O26879 Cervical shortening, unspecified trimester: Secondary | ICD-10-CM | POA: Diagnosis present

## 2015-06-26 DIAGNOSIS — M329 Systemic lupus erythematosus, unspecified: Secondary | ICD-10-CM | POA: Diagnosis present

## 2015-06-26 DIAGNOSIS — R002 Palpitations: Secondary | ICD-10-CM | POA: Diagnosis not present

## 2015-06-26 DIAGNOSIS — R102 Pelvic and perineal pain: Secondary | ICD-10-CM | POA: Diagnosis present

## 2015-06-26 DIAGNOSIS — O99210 Obesity complicating pregnancy, unspecified trimester: Secondary | ICD-10-CM | POA: Diagnosis present

## 2015-06-26 DIAGNOSIS — O26872 Cervical shortening, second trimester: Secondary | ICD-10-CM

## 2015-06-26 DIAGNOSIS — O0990 Supervision of high risk pregnancy, unspecified, unspecified trimester: Secondary | ICD-10-CM

## 2015-06-26 DIAGNOSIS — L930 Discoid lupus erythematosus: Secondary | ICD-10-CM

## 2015-06-26 DIAGNOSIS — Z8489 Family history of other specified conditions: Secondary | ICD-10-CM

## 2015-06-26 HISTORY — DX: Cervical shortening, unspecified trimester: O26.879

## 2015-06-26 LAB — POCT URINALYSIS DIP (DEVICE)
Bilirubin Urine: NEGATIVE
GLUCOSE, UA: NEGATIVE mg/dL
Ketones, ur: NEGATIVE mg/dL
Nitrite: NEGATIVE
PH: 7 (ref 5.0–8.0)
PROTEIN: NEGATIVE mg/dL
SPECIFIC GRAVITY, URINE: 1.02 (ref 1.005–1.030)
UROBILINOGEN UA: 1 mg/dL (ref 0.0–1.0)

## 2015-06-26 LAB — CBC
HEMATOCRIT: 31 % — AB (ref 36.0–46.0)
HEMOGLOBIN: 10.2 g/dL — AB (ref 12.0–15.0)
MCH: 25 pg — AB (ref 26.0–34.0)
MCHC: 32.9 g/dL (ref 30.0–36.0)
MCV: 76 fL — ABNORMAL LOW (ref 78.0–100.0)
Platelets: 134 10*3/uL — ABNORMAL LOW (ref 150–400)
RBC: 4.08 MIL/uL (ref 3.87–5.11)
RDW: 13.5 % (ref 11.5–15.5)
WBC: 5.7 10*3/uL (ref 4.0–10.5)

## 2015-06-26 LAB — TYPE AND SCREEN
ABO/RH(D): O POS
Antibody Screen: NEGATIVE

## 2015-06-26 LAB — ABO/RH: ABO/RH(D): O POS

## 2015-06-26 MED ORDER — ASPIRIN EC 81 MG PO TBEC
81.0000 mg | DELAYED_RELEASE_TABLET | Freq: Every day | ORAL | Status: DC
  Administered 2015-06-26 – 2015-06-27 (×2): 81 mg via ORAL
  Filled 2015-06-26 (×3): qty 1

## 2015-06-26 MED ORDER — ACETAMINOPHEN 325 MG PO TABS: 650.0000 mg | ORAL_TABLET | ORAL | Status: DC | PRN

## 2015-06-26 MED ORDER — CALCIUM CARBONATE ANTACID 500 MG PO CHEW: 2.0000 | CHEWABLE_TABLET | ORAL | Status: DC | PRN

## 2015-06-26 MED ORDER — ZOLPIDEM TARTRATE 5 MG PO TABS: 5.0000 mg | ORAL_TABLET | Freq: Every evening | ORAL | Status: DC | PRN

## 2015-06-26 MED ORDER — NITROFURANTOIN MONOHYD MACRO 100 MG PO CAPS: 100.0000 mg | ORAL_CAPSULE | Freq: Two times a day (BID) | ORAL | Status: DC

## 2015-06-26 MED ORDER — HYDROXYCHLOROQUINE SULFATE 200 MG PO TABS
400.0000 mg | ORAL_TABLET | Freq: Every day | ORAL | Status: DC
  Administered 2015-06-26 – 2015-06-27 (×2): 400 mg via ORAL
  Filled 2015-06-26 (×2): qty 2

## 2015-06-26 MED ORDER — PRENATAL MULTIVITAMIN CH
1.0000 | ORAL_TABLET | Freq: Every day | ORAL | Status: DC
  Administered 2015-06-26 – 2015-06-27 (×2): 1 via ORAL
  Filled 2015-06-26 (×2): qty 1

## 2015-06-26 MED ORDER — CITALOPRAM HYDROBROMIDE 20 MG PO TABS
20.0000 mg | ORAL_TABLET | Freq: Every day | ORAL | Status: DC
  Administered 2015-06-26 – 2015-06-27 (×2): 20 mg via ORAL
  Filled 2015-06-26 (×2): qty 1

## 2015-06-26 MED ORDER — BETAMETHASONE SOD PHOS & ACET 6 (3-3) MG/ML IJ SUSP
12.0000 mg | INTRAMUSCULAR | Status: AC
  Administered 2015-06-26 – 2015-06-27 (×2): 12 mg via INTRAMUSCULAR
  Filled 2015-06-26 (×2): qty 2

## 2015-06-26 MED ORDER — DOCUSATE SODIUM 100 MG PO CAPS
100.0000 mg | ORAL_CAPSULE | Freq: Every day | ORAL | Status: DC
  Administered 2015-06-27: 100 mg via ORAL
  Filled 2015-06-26: qty 1

## 2015-06-26 MED ORDER — PROGESTERONE MICRONIZED 200 MG PO CAPS
200.0000 mg | ORAL_CAPSULE | Freq: Every day | ORAL | Status: DC
  Administered 2015-06-26 – 2015-06-27 (×2): 200 mg via VAGINAL
  Filled 2015-06-26 (×2): qty 1

## 2015-06-26 NOTE — Progress Notes (Signed)
Pt left the clinic without getting lab work done,  TSH.

## 2015-06-26 NOTE — Progress Notes (Signed)
Breastfeeding tip of the week\ Pt states she has increased anxiety/heartracing/sob Flu vaccine today Hgb: trace, Leukocytes: large Pt complains of headaces

## 2015-06-26 NOTE — H&P (Signed)
Faculty Practice Antenatal History and Physical  Anne Mcdowell:[id number] DOB: [birth year] DOA: 06/26/2015  Chief Complaint: Vaginal pressure  HPI: Anne Mcdowell is a 29 y.o. female [phone] with IUP at [redacted]w[redacted]d.  Pregnancy complicated by Lupus, GERD, and Sickle cell Trait.  She has been followed in the high risk clinic since [redacted]w[redacted]d.  She was seen in clinic this morning for routine prenatal visit and complained of some vaginal pressure, as if the "baby is going to come out", which was worse with walking and improved with laying down.  She had a routine [place] to complete the fetal survey and was found to have a shortened cervix to 1cm with "U" shape funneling.     Review of Systems:   Pt complains of intermittent headache.  Denies fevers, chills, nausea, vomiting, LOF, decreased FA, vaginal bleeding, vaginal discharge, abdominal pain, back pain, changes in vision.  Review of systems are otherwise negative  Prenatal History/Complications:   Past Medical History: Past Medical History  Diagnosis Date  . Lupus (HCC)   . Genital warts     Past Surgical History: Past Surgical History  Procedure Laterality Date  . Cholecystectomy    . Cesarean section    . Elective abortion      Obstetrical History: OB History    Gravida Para Term Preterm AB TAB SAB Ectopic Multiple Living   5 1 0 Gynecological History: OB History    Gravida Para Term Preterm AB TAB SAB Ectopic Multiple Living   5 1 0 Social History: Social History   Social History  . Marital Status: Single    Spouse Name: N/A  . Number of Children: N/A  . Years of Education: N/A   Social History Main Topics  . Smoking status: Never Smoker   . Smokeless tobacco: Never Used  . Alcohol Use: No  . Drug Use: No  . Sexual Activity: Yes    Birth Control/ Protection: None   Other Topics Concern  . None   Social History Narrative    Family History: No family history on  file.  Allergies: Allergies  Allergen Reactions  . Penicillins Hives and Swelling  . Vancomycin Hives and Swelling    Prescriptions prior to admission  Medication Sig Dispense Refill Last Dose  . aspirin 81 MG tablet Take 81 mg by mouth daily.   06/25/2015 at Unknown time  . calcium carbonate (TUMS - DOSED IN MG ELEMENTAL CALCIUM) 500 MG chewable tablet Chew 2 tablets by mouth daily as needed for indigestion or heartburn.   06/25/2015 at Unknown time  . citalopram (CELEXA) 20 MG tablet Take 20 mg by mouth at bedtime.    06/25/2015 at Unknown time  . esomeprazole (NEXIUM) 20 MG capsule Take 1 capsule (20 mg total) by mouth daily. 30 capsule 1 06/25/2015 at Unknown time  . hydroxychloroquine (PLAQUENIL) 200 MG tablet TAKE TWO TABLETS BY MOUTH AT BEDTIME   06/25/2015 at Unknown time  . nitrofurantoin, macrocrystal-monohydrate, (MACROBID) 100 MG capsule Take 1 capsule (100 mg total) by mouth 2 (two) times daily. 14 capsule 0   . Prenatal Vit-Fe Fumarate-FA (PRENATAL VITAMIN PO) Take by mouth.   Taking  . promethazine (PHENERGAN) 25 MG tablet Take 0.5-1 tablets (12.5-25 mg total) by mouth every 6 (six) hours as needed. (Patient not taking: Reported on 05/29/2015) 30 tablet 0 Not Taking  Physical Exam: LMP 02/04/2015  Ht 5' (1.524 m)  Wt 164 lb (74.39 kg)  BMI 32.03 kg/m2  LMP 02/04/2015 General appearance: alert, cooperative and no distress Head: Normocephalic, without obvious abnormality, atraumatic Eyes: conjunctivae/corneas clear. PERRL, EOM's intact. Fundi benign. Neck: no adenopathy, no carotid bruit, no JVD, supple, symmetrical, trachea midline and thyroid not enlarged, symmetric, no tenderness/mass/nodules Lungs: clear to auscultation bilaterally Heart: regular rate and rhythm, S1, S2 normal, no murmur, click, rub or gallop Abdomen: soft, non-tender; bowel sounds normal; no masses,  no organomegaly Extremities: extremities normal, atraumatic, no cyanosis or edema Pulses: 2+ and  symmetric Skin: Skin color, texture, turgor normal. No rashes or lesions Lymph nodes: Cervical, supraclavicular, and axillary nodes normal. Neurologic: Alert and oriented X 3, normal strength and tone. Normal symmetric reflexes. Normal coordination and gait variable             Labs on Admission:  Basic Metabolic Panel: No results for input(s): NA, K, CL, CO2, GLUCOSE, BUN, CREATININE, CALCIUM, MG, PHOS in the last 168 hours. Liver Function Tests: No results for input(s): AST, ALT, ALKPHOS, BILITOT, PROT, ALBUMIN in the last 168 hours. No results for input(s): LIPASE, AMYLASE in the last 168 hours. No results for input(s): AMMONIA in the last 168 hours. CBC: No results for input(s): WBC, NEUTROABS, HGB, HCT, MCV, PLT in the last 168 hours.  CBG: No results for input(s): GLUCAP in the last 168 hours.  Radiological Exams on Admission: [place] Mfm Ob Transvaginal  06/26/2015   OBSTETRICAL ULTRASOUND: This exam was performed within a [place] [facility]. The OB US report was generated in the AS system, and faxed to the ordering physician.   This report is available in the [employer]. See the AS Obstetric US report via the Image Link.  [place] Mfm Ob Follow Up  06/26/2015   OBSTETRICAL ULTRASOUND: This exam was performed within a  Ultrasound Department. The OB US report was generated in the AS system, and faxed to the ordering physician.   This report is available in the [employer]. See the AS Obstetric US report via the Image Link.    Assessment/Plan Present on Admission:  . Sickle cell trait (HCC) . Disseminated lupus erythematosus (HCC) . Obesity affecting pregnancy, antepartum  1. IUP at 23w3e 2. Short cervix 3. Lupus   Admit  Prometrium  BMZ  Repeat US in 48 hours  Patient contemplating pessary  Magnesium if begins to have contractions  NST twice daily  Continue Plaquenil   [name], DO 06/26/2015 1:51 PM Faculty Practice Attending  Physician [facility] Attending Phone #: [phone]

## 2015-06-26 NOTE — Progress Notes (Signed)
Subjective:  Anne Mcdowell is a 29 y.o. [phone] at [redacted]w[redacted]d being seen today for ongoing prenatal care.  Patient reports frequent headaches resolved with Tylenol.  Also reports palpitations and shortness of breath occuring daily.  Denies chest pain.  "Feels anxious".  Denies UTI symptoms..  Contractions: Not present.  Vag. Bleeding: None. Movement: Present. Denies leaking of fluid.   The following portions of the patient's history were reviewed and updated as appropriate: allergies, current medications, past family history, past medical history, past social history, past surgical history and problem list.   Objective:   Filed Vitals:   06/26/15 1113  BP: 121/72  Pulse: 99  Temp: 99.2 F (37.3 C)  Weight: 163 lb 8 oz (74.163 kg)    Fetal Status: Fetal Heart Rate (bpm): 144 Fundal Height: 25 cm Movement: Present     General:  Alert, oriented and cooperative. Patient is in no acute distress.  Skin: Skin is warm and dry. No rash noted.   Cardiovascular: Normal heart rate noted; no abnormal sounds noted.    Respiratory: Normal respiratory effort, no problems with respiration noted; lungs clear TA.  Abdomen: Soft, gravid, appropriate for gestational age. Pain/Pressure: Present     Pelvic: Vag. Bleeding: None     Cervical exam deferred        Extremities: Normal range of motion.  Edema: Trace  Mental Status: Normal mood and affect. Normal behavior. Normal judgment and thought content.   Urinalysis: Urine Protein: Negative Urine Glucose: Negative  Assessment and Plan:  Pregnancy: G5P0131 at [redacted]w[redacted]d  1. Flu vaccine need - Flu Vaccine QUAD 36+ mos IM; Standing - Flu Vaccine QUAD 36+ mos IM   2. Bacteria in urine - nitrofurantoin, macrocrystal-monohydrate, (MACROBID) 100 MG capsule; Take 1 capsule (100 mg total) by mouth 2 (two) times daily.  Dispense: 14 capsule; Refill: 0 - Culture, OB Urine  3. Palpitations/Shortness of Breath - Refer to cardiology for evaluation - Check  TSH  Preterm labor symptoms and general obstetric precautions including but not limited to vaginal bleeding, contractions, leaking of fluid and fetal movement were reviewed in detail with the patient. Please refer to After Visit Summary for other counseling recommendations.  Return in about 3 weeks (around 07/17/2015).   Eino Farber Kennith Gain, CNM

## 2015-06-27 ENCOUNTER — Inpatient Hospital Stay (HOSPITAL_COMMUNITY): Payer: Medicaid Other

## 2015-06-27 ENCOUNTER — Telehealth: Payer: Self-pay

## 2015-06-27 LAB — CULTURE, OB URINE
Colony Count: NO GROWTH
Organism ID, Bacteria: NO GROWTH

## 2015-06-27 MED ORDER — ASPIRIN EC 81 MG PO TBEC
81.0000 mg | DELAYED_RELEASE_TABLET | Freq: Every day | ORAL | Status: DC
  Administered 2015-06-28: 81 mg via ORAL
  Filled 2015-06-27: qty 1

## 2015-06-27 MED ORDER — HYDROXYCHLOROQUINE SULFATE 200 MG PO TABS
400.0000 mg | ORAL_TABLET | Freq: Every day | ORAL | Status: DC
  Filled 2015-06-27: qty 2

## 2015-06-27 MED ORDER — ZOLPIDEM TARTRATE 5 MG PO TABS: 5.0000 mg | ORAL_TABLET | Freq: Every evening | ORAL | Status: DC | PRN

## 2015-06-27 MED ORDER — PRENATAL MULTIVITAMIN CH
1.0000 | ORAL_TABLET | Freq: Every day | ORAL | Status: DC
  Administered 2015-06-28: 1 via ORAL
  Filled 2015-06-27: qty 1

## 2015-06-27 MED ORDER — ACETAMINOPHEN 325 MG PO TABS: 650.0000 mg | ORAL_TABLET | ORAL | Status: DC | PRN

## 2015-06-27 MED ORDER — DOCUSATE SODIUM 100 MG PO CAPS
100.0000 mg | ORAL_CAPSULE | Freq: Every day | ORAL | Status: DC
  Administered 2015-06-28: 100 mg via ORAL
  Filled 2015-06-27: qty 1

## 2015-06-27 MED ORDER — NITROFURANTOIN MONOHYD MACRO 100 MG PO CAPS
100.0000 mg | ORAL_CAPSULE | Freq: Two times a day (BID) | ORAL | Status: DC
  Administered 2015-06-27 – 2015-06-28 (×3): 100 mg via ORAL
  Filled 2015-06-27 (×4): qty 1

## 2015-06-27 MED ORDER — NITROFURANTOIN MONOHYD MACRO 100 MG PO CAPS: 100.0000 mg | ORAL_CAPSULE | Freq: Two times a day (BID) | ORAL | Status: DC

## 2015-06-27 MED ORDER — CITALOPRAM HYDROBROMIDE 20 MG PO TABS
20.0000 mg | ORAL_TABLET | Freq: Every day | ORAL | Status: DC
  Filled 2015-06-27: qty 1

## 2015-06-27 MED ORDER — PROGESTERONE MICRONIZED 200 MG PO CAPS: 200.0000 mg | ORAL_CAPSULE | Freq: Every day | ORAL | Status: DC

## 2015-06-27 MED ORDER — CALCIUM CARBONATE ANTACID 500 MG PO CHEW: 2.0000 | CHEWABLE_TABLET | ORAL | Status: DC | PRN

## 2015-06-27 NOTE — Progress Notes (Addendum)
FACULTY PRACTICE ANTEPARTUM COMPREHENSIVE PROGRESS NOTE  Anne Mcdowell is a 29 y.o. [phone] at [redacted]w[redacted]d who is admitted for short cervix of 1.1 cm in length, with funneling.  EDD: 10/20/15  Fetal presentation is variable.  Length of Stay:  1 Days. Admitted 06/26/2015  Subjective: Denies any pelvic pressure on contractions Patient reports good fetal movement.  She reports no uterine contractions, no bleeding and no loss of fluid per vagina.  Vitals:  Blood pressure 113/63, pulse 81, temperature 98.2 F (36.8 C), temperature source Oral, resp. rate 16, height 5' (1.524 m), weight 164 lb (74.39 kg), last menstrual period 02/04/2015, SpO2 93 %. Physical Examination: CONSTITUTIONAL: Well-developed, well-nourished female in no acute distress.  HENT:  Normocephalic, atraumatic, External right and left ear normal. Oropharynx is clear and moist EYES: Conjunctivae and EOM are normal. Pupils are equal, round, and reactive to light. No scleral icterus.  NECK: Normal range of motion, supple, no masses SKIN: Skin is warm and dry. No rash noted. Not diaphoretic. No erythema. No pallor. NEUROLGIC: Alert and oriented to person, place, and time. Normal reflexes, muscle tone coordination. No cranial nerve deficit noted. PSYCHIATRIC: Normal mood and affect. Normal behavior. Normal judgment and thought content. CARDIOVASCULAR: Normal heart rate noted, regular rhythm RESPIRATORY: Effort and breath sounds normal, no problems with respiration noted MUSCULOSKELETAL: Normal range of motion. No edema and no tenderness. 2+ distal pulses. ABDOMEN: Soft, nontender, nondistended, gravid. CERVIX:  Deferred  Fetal monitoring: FHR: 145 bpm, reassuring for GA Uterine activity: irritability noted at rare occasions  Results for orders placed or performed during the hospital encounter of 06/26/15 (from the past 48 hour(s))  CBC     Status: Abnormal   Collection Time: 06/26/15  4:28 PM  Result Value Ref Range   WBC 5.7  4.0 - 10.5 K/uL   RBC 4.08 3.87 - 5.11 MIL/uL   Hemoglobin 10.2 (L) 12.0 - 15.0 g/dL   HCT 91.4 (L) 78.2 - 95.6 %   MCV 76.0 (L) 78.0 - 100.0 fL   MCH 25.0 (L) 26.0 - 34.0 pg   MCHC 32.9 30.0 - 36.0 g/dL   RDW 21.3 08.6 - 57.8 %   Platelets 134 (L) 150 - 400 K/uL  Type and screen     Status: None   Collection Time: 06/26/15  4:29 PM  Result Value Ref Range   ABO/RH(D) O POS    Antibody Screen NEG    Sample Expiration 06/29/2015   ABO/Rh     Status: None   Collection Time: 06/26/15  4:30 PM  Result Value Ref Range   ABO/RH(D) O POS     [place] Mfm Ob Transvaginal  06/26/2015   OBSTETRICAL ULTRASOUND: This exam was performed within a [place] Ultrasound Department. The OB US report was generated in the AS system, and faxed to the ordering physician.   This report is available in the [employer]. See the AS Obstetric US report via the Image Link.  [place] Mfm Ob Follow Up  06/26/2015   OBSTETRICAL ULTRASOUND: This exam was performed within a [place] Ultrasound Department. The OB US report was generated in the AS system, and faxed to the ordering physician.   This report is available in the [employer]. See the AS Obstetric US report via the Image Link.   Current scheduled medications . aspirin EC  81 mg Oral Daily  . betamethasone acetate-betamethasone sodium phosphate  12 mg Intramuscular Q24H  . citalopram  20 mg Oral QHS  . docusate  sodium  100 mg Oral Daily  . hydroxychloroquine  400 mg Oral QHS  . nitrofurantoin (macrocrystal-monohydrate)  100 mg Oral Q12H  . prenatal multivitamin  1 tablet Oral Q1200  . progesterone  200 mg Vaginal QHS    I have reviewed the patient's current medications.  ASSESSMENT: Principal Problem:   Short cervical length during pregnancy Active Problems:   Supervision of high risk pregnancy, antepartum   Sickle cell trait (HCC)   Disseminated lupus erythematosus (HCC)   Obesity affecting pregnancy, antepartum   Previous cesarean  section   PLAN: Discussed management of short cervix with Dr. Marjo Bicker (MFM); he will consult patient and evluate for placement of pessary today Continue betamethasone regimen Continue tocometry as needed Continue Prometrium Continue Plaquenil, ASA for SLE Continue Macrobid for UTI, follow up urine culture Continue routine antenatal care, may consider discharge tomorrow if remains stable.   Tereso Newcomer, MD Attending Obstetrician & Gynecologist Faculty Practice, [facility]

## 2015-06-27 NOTE — Progress Notes (Signed)
MFM Staff Consult Note:  I reviewed with the patient the pathophysiology of preterm delivery in pregnancy and that often the etiology of the preterm delivery is not ascertained from the clinical scenario. I explained that infection may be a cause of preterm labor and preterm delivery. Advanced cervical dilation can also lead to infection; therefore, there are 2 mechanisms that could be contributing to the preterm delivery.   While this patient is not currently in active labor or showing evidence of intrauterine infection, clinical expertise cannot wholely rule out occult presence of infection or impending labor.  Nonetheless, given that on my digital exam of closed/~90-100 % effacement/high/anterior and absence of regular contractions or clinical evidence of infection, I can offer her continued vaginal progesterone (prometrium  pv qhs) and pessary placement.  She understands that at >23 weeks, she is no longer a candidate for rescue cerclage and never was given that her cervix was apparently closed prior to 23 weeks.    I reviewed the use of pessary for purpose of supporting the cervix and extending the length of the pregnancy.  She understands that the literature is quite limited.  I discussed risks, benefit proposed, and technical aspects of the placement.  She desired the procedure, understood the rationale behind the approach, and gave informed consent.  Impressions: 1. SIUP at [redacted]w[redacted]d 2. no active labor 3. no clinical evidence of intraamniotic infection 4. cervical insufficiency, candidate for pessary and vaginal progesterone  5. Patient consented and had successful placement of pessary  Recommendations: 1. TVUS in 1 week for cervical length as outpatient then q2 weeks thereafter until 28 weeks. 2. Preterm labor precautions 3. prometrium  pv qhs until 36 weeks 4. removal of pessary at 36 weeks   Time Spent: I spent in excess of 30 minutes in consultation with this patient to  review records, evaluate her case, and provide her with an adequate discussion and education. More than 50% of this time was spent in direct face-to-face counseling. It was a pleasure seeing your patient in the hospital today. Thank you for consultation. Please do not hesitate to contact our service for any further questions.  Thank you,  Sweet Jarvis, Louann Sjogren, MD, MS, FACOG Assistant Professor Section of Maternal-Fetal Medicine [facility]

## 2015-06-27 NOTE — Telephone Encounter (Signed)
Per Elenora Fender, CNM pt needs a referral to cardiologist for palpitations.   Appt scheduled for [facility] for November 3rd @ 1500.  Called pt and informed pt of [place] cardiology appt. And gave pt contact info for facility.

## 2015-06-28 ENCOUNTER — Inpatient Hospital Stay (HOSPITAL_COMMUNITY): Payer: Medicaid Other

## 2015-06-28 DIAGNOSIS — Z3A23 23 weeks gestation of pregnancy: Secondary | ICD-10-CM

## 2015-06-28 DIAGNOSIS — K8020 Calculus of gallbladder without cholecystitis without obstruction: Secondary | ICD-10-CM | POA: Insufficient documentation

## 2015-06-28 DIAGNOSIS — O26872 Cervical shortening, second trimester: Principal | ICD-10-CM

## 2015-06-28 MED ORDER — DOCUSATE SODIUM 100 MG PO CAPS: 100.0000 mg | ORAL_CAPSULE | Freq: Every day | ORAL | Status: DC

## 2015-06-28 MED ORDER — NITROFURANTOIN MONOHYD MACRO 100 MG PO CAPS: 100.0000 mg | ORAL_CAPSULE | Freq: Two times a day (BID) | ORAL | Status: DC

## 2015-06-28 MED ORDER — PRENATAL MULTIVITAMIN CH: 1.0000 | ORAL_TABLET | Freq: Every day | ORAL | Status: DC

## 2015-06-28 MED ORDER — PROGESTERONE MICRONIZED 200 MG PO CAPS: 200.0000 mg | ORAL_CAPSULE | Freq: Every day | ORAL | Status: DC

## 2015-06-28 NOTE — Discharge Summary (Addendum)
OB Discharge Summary     Patient Name: Anne Mcdowell DOB: 13-Jan-1986 MRN: [id number]  Date of admission: 06/26/2015 Delivering MD: This patient has no babies on file.  Date of discharge: 06/28/2015  Admitting diagnosis: SHORT CERVIX, PTL Intrauterine pregnancy: [redacted]w[redacted]d     Secondary diagnosis: Lupus, sickle cell trait     Discharge diagnosis: short cervix                                                                                                Hospital course:  29 yo G5 P0131 was found to have a shortened cervix 2 days ago.  Cervix 1.1 cm  Pt placed on bedrest and Prometrium started.  Pt re-scanned yesterday and cervix found to be 0.5 cm.  Pt agreed to have pessary placed.  Exam the following day after pessary placement was improved.  Sterile speculum exam shoes a closed external os with 2 cm of cervix coming through pessary.  No blood or pooling.  Pt to receive NICU consult before discharge.    Physical exam  Filed Vitals:   06/27/15 1615 06/27/15 1617 06/27/15 2047 06/27/15 2240  BP:  108/62 115/60 116/68  Pulse:  92 85 72  Temp: 98.8 F (37.1 C)  98.6 F (37 C) 98.3 F (36.8 C)  TempSrc: Oral  Oral Oral  Resp:  Height:      Weight:      SpO2:       General: alert, cooperative and no distress Pulm:  Normal effort and rate Abdomen:  Soft, gravid, NT Cervix:  Closed--cervix feels about 50% efface (about 2 cm coming through pessary), tone, no fetal part felt. DVT Evaluation: No evidence of DVT seen on physical exam. Negative Homan's sign.  Labs: Lab Results  Component Value Date   WBC 5.7 06/26/2015   HGB 10.2* 06/26/2015   HCT 31.0* 06/26/2015   MCV 76.0* 06/26/2015   PLT 134* 06/26/2015   CMP Latest Ref Rng 04/07/2015  Glucose 70 - 99 mg/dL 78(G)  BUN 6 - 23 mg/dL 6  Creatinine 9.56 - 2.13 mg/dL 0.86  Sodium 578 - 469 mEq/L 136  Potassium 3.5 - 5.3 mEq/L 3.9  Chloride 96 - 112 mEq/L 103  CO2 19 - 32 mEq/L 23  Calcium 8.4 - 10.5 mg/dL 9.0   Total Protein 6.0 - 8.3 g/dL 6.5  Total Bilirubin 0.2 - 1.2 mg/dL 0.3  Alkaline Phos 39 - 117 U/L 32(L)  AST 0 - 37 U/L 14  ALT 0 - 35 U/L 12   Toco:  NO contraction.  FHT reassuring.  Discharge instruction: per After Visit Summary and "Baby and Me Booklet".  Medications:  Current facility-administered medications:  .  acetaminophen (TYLENOL) tablet 650 mg, 650 mg, Oral, Q4H PRN, Tereso Newcomer, MD .  aspirin EC tablet 81 mg, 81 mg, Oral, Daily, Ugonna A Anyanwu, MD .  calcium carbonate (TUMS - dosed in mg elemental calcium) chewable tablet 400 mg of elemental calcium, 2 tablet, Oral, Q4H PRN, Ugonna A Anyanwu, MD .  citalopram (CELEXA) tablet 20 mg, 20  mg, Oral, QHS, Tereso Newcomer, MD, 20 mg at 06/27/15 2309 .  docusate sodium (COLACE) capsule 100 mg, 100 mg, Oral, Daily, Ugonna A Anyanwu, MD .  hydroxychloroquine (PLAQUENIL) tablet 400 mg, 400 mg, Oral, QHS, Ugonna A Anyanwu, MD, 400 mg at 06/27/15 2309 .  nitrofurantoin (macrocrystal-monohydrate) (MACROBID) capsule 100 mg, 100 mg, Oral, Q12H, Casey Burkitt, MD, 100 mg at 06/27/15 2222 .  prenatal multivitamin tablet 1 tablet, 1 tablet, Oral, Q1200, Ugonna A Anyanwu, MD .  progesterone (PROMETRIUM) capsule 200 mg, 200 mg, Vaginal, QHS, Ugonna A Anyanwu, MD, 200 mg at 06/27/15 2309 .  zolpidem (AMBIEN) tablet 5 mg, 5 mg, Oral, QHS PRN, Tereso Newcomer, MD  Diet: routine diet  Activity: Limit physical activity.  Can walk, have meals at the table, shower, personal care.   Outpatient follow up:  5 days to OB (one week to MFM for repeat scan).   06/28/2015 Lesly Dukes., MD

## 2015-06-28 NOTE — Progress Notes (Signed)
Speculum exam for pessary postion by Dr. Penne Lash

## 2015-06-28 NOTE — Consult Note (Addendum)
[place]  Consultation Service: Neonatology   Dr. Gala Romney has asked for consultation on Anne Mcdowell, MRN # [id number] regarding the care of a premature infant at 62 5/[redacted] weeks EGA. Thank you for inviting [place] to see this patient.   Reason for consult:  Explain the possible complications, the prognosis, and the care of a premature infant at 63 5/[redacted] weeks EGA  Chief complaint: 10 yop [place]  female with a 23 5/7 week IUP.   My key findings of this patient's HPI are:  I have reviewed the patient's chart and have met with her. The salient information is as follows: 29yo who presented with shortened cervix s/p pessary placement.  Mother has sickle cell trait and Lupus. No evidence off fetal bradycardia.   Prenatal care: good Maternal antibiotics: This patient's mother is not on file. Maternal Steroids:yes, x2  Most recent dose:10/7    My recommendations for this patient and my actions included:   1. In the presence of the mother, I spent 40 minutes discussing the possible complications and outcomes of prematurity at this gestational age. I gave the patient a March of D[employer] handout, written in lay language, that discussed the common complications and survival data of the premature infant and a summary handout with graph and table. I discussed the potential need for resuscitation at birth, mechanical ventilation and surfactant administration for respiratory distress, IV fluids pending establishment of enteral feeds (encouraged breast milk feeding to which she is open to doing), antibiotics for possible sepsis, temperature support, and monitoring. I also discussed the potential risk of complications such as intracranial hemorrhage, retinopathy, hearing deficit, and chronic lung disease. I also discussed the potential length of stay in the neonatal intensive care unit for about 18 weeks. I discussed this with parents in detail and  they expressed an understanding of the risks and complications of prematurity.   2. I also discussed, based on NICHD calculator, the expected survival of an infant born at 54 5/[redacted] weeks EGA which is ~30%. We further discussed that roughly 90% of neonates born at this age have severe neurological complications and that around >95% have school difficulties. She expressed an understanding of this information.   3. I informed her that the NICU team would be present at the delivery. She agreed that all appropriate medical measures could be taken to resuscitate her infant at the delivery. She also understood that depending on her infant's initial condition and NICU course, some difficult decisions may have to be made.   4. A visit to the NICU by the infant's mother and/or a significant other was not discussed.   Final Impression:  Anne Mcdowell is a 29yoG5 P74  female with a 23 5/7 week IUP who is not threatening to deliver at this time and who now understands the possible complications and prognosis of her infant if she were. ______________________________________________________________________  Thank you for asking [place] to participate in the care of this patient. Please do not hesitate to contact us again if you are aware of any further ways we can be of assistance.   Sincerely,  Anne Sabal. Katherina Mires, MD Neonatologist  I spent ~40 minutes in consultation time, of which 30 minutes was spent in direct face to face counseling.

## 2015-06-28 NOTE — Plan of Care (Signed)
Problem: Consults Goal: Birthing Suites Patient Information Press F2 to bring up selections list  Outcome: Completed/Met Date Met:  06/28/15  Pt < [redacted] weeks EGA

## 2015-07-01 ENCOUNTER — Encounter (HOSPITAL_COMMUNITY): Payer: Self-pay | Admitting: *Deleted

## 2015-07-01 ENCOUNTER — Inpatient Hospital Stay (HOSPITAL_COMMUNITY)
Admission: AD | Admit: 2015-07-01 | Discharge: 2015-07-01 | Disposition: A | Discharge status: Home / Self Care | Payer: Medicaid Other | Source: Ambulatory Visit | Attending: Family Medicine | Admitting: Family Medicine

## 2015-07-01 DIAGNOSIS — R0602 Shortness of breath: Secondary | ICD-10-CM | POA: Diagnosis not present

## 2015-07-01 DIAGNOSIS — M329 Systemic lupus erythematosus, unspecified: Secondary | ICD-10-CM

## 2015-07-01 DIAGNOSIS — O99212 Obesity complicating pregnancy, second trimester: Secondary | ICD-10-CM

## 2015-07-01 DIAGNOSIS — O26892 Other specified pregnancy related conditions, second trimester: Secondary | ICD-10-CM | POA: Insufficient documentation

## 2015-07-01 DIAGNOSIS — O99112 Other diseases of the blood and blood-forming organs and certain disorders involving the immune mechanism complicating pregnancy, second trimester: Secondary | ICD-10-CM | POA: Diagnosis not present

## 2015-07-01 DIAGNOSIS — R002 Palpitations: Secondary | ICD-10-CM | POA: Diagnosis present

## 2015-07-01 DIAGNOSIS — Z7982 Long term (current) use of aspirin: Secondary | ICD-10-CM | POA: Insufficient documentation

## 2015-07-01 DIAGNOSIS — Z3A24 24 weeks gestation of pregnancy: Secondary | ICD-10-CM | POA: Insufficient documentation

## 2015-07-01 DIAGNOSIS — Z880 Allergy status to penicillin: Secondary | ICD-10-CM | POA: Insufficient documentation

## 2015-07-01 DIAGNOSIS — R51 Headache: Secondary | ICD-10-CM | POA: Insufficient documentation

## 2015-07-01 DIAGNOSIS — Z98891 History of uterine scar from previous surgery: Secondary | ICD-10-CM

## 2015-07-01 DIAGNOSIS — O26872 Cervical shortening, second trimester: Secondary | ICD-10-CM

## 2015-07-01 LAB — TROPONIN I: Troponin I: <0.03 ng/mL (ref <0.031)

## 2015-07-01 MED ORDER — HYDROXYZINE HCL 25 MG PO TABS: 25.0000 mg | ORAL_TABLET | Freq: Three times a day (TID) | ORAL | Status: DC | PRN

## 2015-07-01 NOTE — MAU Note (Signed)
Urine sent to lab .

## 2015-07-01 NOTE — MAU Note (Signed)
Pt reports she has been having "heart palpitations" for the last week and her doctor told her to come here if she continued. She states that when she is having them it makes her feel short of breath. Denies abd pain or bleeding.

## 2015-07-01 NOTE — Discharge Instructions (Signed)

## 2015-07-01 NOTE — MAU Note (Signed)
PT SAYS SHE STILL FEELS  HEART RACING     AND STILL HAS    H/A.-  WHILE   TEXTING  ON PHONE.

## 2015-07-01 NOTE — MAU Provider Note (Signed)
Chief Complaint:  Palpitations   First Provider Initiated Contact with Patient 07/01/15 2050    HPI: Anne Mcdowell is a 29 y.o. [phone] at [redacted]w[redacted]d who presents to maternity admissions reporting palpitations for the past 2 weeks. She states she has been experiencing these primarily when laying down. SOB and HA are associated w/ these. Patient does not believe that this is anxiety related b/c sx onset was prior to the events of this past weekend. Only new medication is procardia. Patient states that she is currently experiencing these palpitations, HA, and SOB -- HR 90, RR18, appears comfortable, speaking in full sentences. Patient denies any Fam Hx of cardiac conditions. She has been told that she has an "innocent murmur" in the past. Denies contractions, leakage of fluid or vaginal bleeding.   Pregnancy Course:  Clinic  [facility] Prenatal Labs  Dating 5.6 week [place] 02/23/15 Blood type: O/POS/-- (07/14 1533)   Genetic Screen 1 Screen:    AFP:     Quad:     NIPS: Antibody:NEG (07/14 1533)  Anatomic [place]  19 wks, nml, limited view of heart > rescan in 4 wks Rubella: 1.54 (07/14 1533)Immune  GTT Early:       84        Third trimester:  RPR: NON REAC (07/14 1533)   Flu vaccine 06/26/15 Hep B: neg  TDaP vaccine                                               Rhogam: HIV: NONREACTIVE (07/14 1533)  Baby Food                                               GBS: (For PCN allergy, check sensitivities)  Contraception  Pap:  Circumcision    Pediatrician    Support Person    Hb S sickle trait and Lupus   Past Medical History: Past Medical History  Diagnosis Date  . Lupus (HCC)   . Genital warts     Past obstetric history: OB History  Gravida Para Term Preterm AB SAB TAB Ectopic Multiple Living  5 1 0 # Outcome Date GA Lbr Len/2nd Weight Sex Delivery Anes PTL Lv  5 Current           4 SAB 2015          3 Preterm 04/18/07 [redacted]w[redacted]d  2.926 kg (6 lb 7.2 oz) M CS-LTranv EPI  Y  2 SAB 2007 [redacted]w[redacted]d          1 TAB 2003              Past Surgical History: Past Surgical History  Procedure Laterality Date  . Cholecystectomy    . Cesarean section    . Elective abortion       Family History: History reviewed. No pertinent family history.  Social History: Social History  Substance Use Topics  . Smoking status: Never Smoker   . Smokeless tobacco: Never Used  . Alcohol Use: No    Allergies:  Allergies  Allergen Reactions  . Penicillins Hives and Swelling    Has patient had a PCN reaction causing immediate rash, facial/tongue/throat swelling, SOB or lightheadedness  with hypotension: Yes Has patient had a PCN reaction causing severe rash involving mucus membranes or skin necrosis: No Has patient had a PCN reaction that required hospitalization No Has patient had a PCN reaction occurring within the last 10 years: No If all of the above answers are "NO", then may proceed with Cephalosporin use.  . Vancomycin Hives and Swelling    Meds:  Prescriptions prior to admission  Medication Sig Dispense Refill Last Dose  . acetaminophen (TYLENOL) 500 MG tablet Take 500 mg by mouth every 6 (six) hours as needed for headache.   Past Week at Unknown time  . aspirin 81 MG tablet Take 81 mg by mouth daily.   06/30/2015 at Unknown time  . calcium carbonate (TUMS - DOSED IN MG ELEMENTAL CALCIUM) 500 MG chewable tablet Chew 2 tablets by mouth daily as needed for indigestion or heartburn.   Past Week at Unknown time  . citalopram (CELEXA) 20 MG tablet Take 20 mg by mouth at bedtime.    06/30/2015 at Unknown time  . docusate sodium (COLACE) 100 MG capsule Take 1 capsule (100 mg total) by mouth daily. 10 capsule 0 Past Week at Unknown time  . hydroxychloroquine (PLAQUENIL) 200 MG tablet TAKE TWO TABLETS BY MOUTH AT BEDTIME   06/30/2015 at Unknown time  . NEXIUM 20 MG capsule TAKE ONE CAPSULE BY MOUTH EVERY DAY 30 capsule 2 Past Month at Unknown time  . Prenatal Vit-Fe Fumarate-FA (PRENATAL MULTIVITAMIN)  TABS tablet Take 1 tablet by mouth daily at 12 noon. 30 tablet 6 Past Week at Unknown time  . progesterone (PROMETRIUM) 200 MG capsule Place 1 capsule (200 mg total) vaginally at bedtime. 30 capsule 3 07/01/2015 at Unknown time    ROS: Pertinent findings in history of present illness.  Physical Exam  Blood pressure 112/70, pulse 87, temperature 98.3 F (36.8 C), temperature source Oral, resp. rate 18, last menstrual period 02/04/2015, SpO2 100 %. GENERAL: Well-developed, well-nourished female in no acute distress.  HEENT: normocephalic HEART: normal rate RESP: normal effort ABDOMEN: Soft, non-tender, gravid appropriate for gestational age EXTREMITIES: Nontender, no edema NEURO: alert and oriented   Labs: Results for orders placed or performed during the hospital encounter of 07/01/15 (from the past 24 hour(s))  Troponin I     Status: None   Collection Time: 07/01/15  9:25 PM  Result Value Ref Range   Troponin I <0.03 <0.031 ng/mL    Imaging:  [place] Mfm Ob Transvaginal  06/27/2015   OBSTETRICAL ULTRASOUND: This exam was performed within a [place] Ultrasound Department. The OB US report was generated in the AS system, and faxed to the ordering physician.   This report is available in the [employer]. See the AS Obstetric US report via the Image Link.  [place] Mfm Ob Transvaginal  06/26/2015   OBSTETRICAL ULTRASOUND: This exam was performed within a [place] Ultrasound Department. The OB US report was generated in the AS system, and faxed to the ordering physician.   This report is available in the [employer]. See the AS Obstetric US report via the Image Link.  [place] Mfm Ob Follow Up  06/26/2015   OBSTETRICAL ULTRASOUND: This exam was performed within a [place] Ultrasound Department. The OB US report was generated in the AS system, and faxed to the ordering physician.   This report is available in the [employer]. See the AS Obstetric US report via the Image Link.  MAU  Course: Troponin and EKG ordered  Assessment:  1. Palpitations w/ associated SOB and HA >> unknown etiology at this time but most likely cause at this time is Anxiety related. DDx would include cardiac pathology; EKG was mostly reassuring, and troponin was negative. PE could also be a cause, especially w/ a h/o SLE. But a RR of 18 w/ O2 sats of 100%, no JVD, no peripheral edema, and in NAD makes this very unlikely. No BNP or D-Dimer ordered due to low clinical suspicion and risk for false negatives during pregnancy and w/ SLE hx.   - Wells criteria 0-1.5 >> Low risk and PERC neg  Plan: Atarax prescription provided today. We have asked that she f/u w/ her OB/GYN later this week or early next week. Encouraged rest and low stress activities. Discharge home Labor precautions and fetal kick counts     Follow-up Information    Follow up with [facility]. Call in 1 day.   Specialty:  Obstetrics and Gynecology   Why:  for appointment later this week or early next week.   Contact information:   [address] [place] [address] [phone]       Medication List    TAKE these medications        acetaminophen 500 MG tablet  Commonly known as:  TYLENOL  Take 500 mg by mouth every 6 (six) hours as needed for headache.     aspirin 81 MG tablet  Take 81 mg by mouth daily.     calcium carbonate 500 MG chewable tablet  Commonly known as:  TUMS - dosed in mg elemental calcium  Chew 2 tablets by mouth daily as needed for indigestion or heartburn.     citalopram 20 MG tablet  Commonly known as:  CELEXA  Take 20 mg by mouth at bedtime.     docusate sodium 100 MG capsule  Commonly known as:  COLACE  Take 1 capsule (100 mg total) by mouth daily.     hydroxychloroquine 200 MG tablet  Commonly known as:  PLAQUENIL  TAKE TWO TABLETS BY MOUTH AT BEDTIME     hydrOXYzine 25 MG tablet  Commonly known as:  ATARAX/VISTARIL  Take 1 tablet (25 mg total) by mouth 3  (three) times daily as needed.     NEXIUM 20 MG capsule  Generic drug:  esomeprazole  TAKE ONE CAPSULE BY MOUTH EVERY DAY     prenatal multivitamin Tabs tablet  Take 1 tablet by mouth daily at 12 noon.     progesterone 200 MG capsule  Commonly known as:  PROMETRIUM  Place 1 capsule (200 mg total) vaginally at bedtime.        Kathee Delton, MD 07/01/2015 11:11 PM  OB fellow attestation: I have seen and examined this patient; I agree with above documentation in the resident's note.   Anne Mcdowell is a 29 y.o. [phone] reporting SOB +FM, denies LOF, VB, contractions, vaginal discharge.  PE: BP 118/66 mmHg  Pulse 82  Temp(Src) 98.3 F (36.8 C) (Oral)  Resp 18  SpO2 100%  LMP 02/04/2015 Gen: calm comfortable, NAD CV: RR, 2/2 systolic murmur (likely flow murmur) Resp: normal effort, no distress Abd: gravid  ROS, labs, PMH reviewed NST reassuring for gestational age  Plan: - Agree with above and reassured against red flag diagnoses including cardiac etiology and PE.  Unlikely PE given lack of chest pain, normal rate, and normal oxygenation. EKG is reassuring but did not flipped Tw[place] in V1-3 but unsure of chronicity and  troponin is negative. Patient has risk factors for hypercoagulability given pregnancy and  vascular/collagen disease.  - Will trial atarax for anxiety - follow up in [facility]  Federico Flake, MD 11:46 PM

## 2015-07-01 NOTE — MAU Note (Signed)
PT  SAYS  SHE HAS BEEN FEELING   LIKE HER HEART IS  RACING -   STARTED  2 WEEKS  AGO  -  COMES  / GOES.    SHE  SAW DR  ON   Thursday  - TOLD  IF HAPPENS  AGAIN   COME  TO HOSPITAL  .     Marland Kitchen  WHEN FEELING COMES - THIS EPISODE STARTED    AT 8 PM - BUT  NOT AS BAD  NOW.     UNSURE IF ANXIETY- SHE IS WORRIED   ABOUT ALL GOING ON WITH   PREG.     ALSO   GETS H/A  WITH HEART RACING-  HAS H/A  NOW.

## 2015-07-03 ENCOUNTER — Ambulatory Visit (INDEPENDENT_AMBULATORY_CARE_PROVIDER_SITE_OTHER): Payer: Medicaid Other | Admitting: Obstetrics & Gynecology

## 2015-07-03 VITALS — BP 121/66 | HR 99 | Temp 99.0°F | Wt 159.9 lb

## 2015-07-03 DIAGNOSIS — O0992 Supervision of high risk pregnancy, unspecified, second trimester: Secondary | ICD-10-CM

## 2015-07-03 LAB — POCT URINALYSIS DIP (DEVICE)
Bilirubin Urine: NEGATIVE
GLUCOSE, UA: NEGATIVE mg/dL
KETONES UR: NEGATIVE mg/dL
Nitrite: NEGATIVE
PH: 7 (ref 5.0–8.0)
PROTEIN: 30 mg/dL — AB
UROBILINOGEN UA: 1 mg/dL (ref 0.0–1.0)

## 2015-07-03 NOTE — Progress Notes (Signed)
Now has pessary in place and using prometrium  Subjective:  Anne Mcdowell is a 29 y.o. [phone] at 6621w3d being seen today for ongoing prenatal care.  Patient reports pressure.  Contractions: Not present.  Vag. Bleeding: None. Movement: Present. Denies leaking of fluid.   The following portions of the patient's history were reviewed and updated as appropriate: allergies, current medications, past family history, past medical history, past social history, past surgical history and problem list. Problem list updated.  Objective:   Filed Vitals:   07/03/15 0857  BP: 121/66  Pulse: 99  Temp: 99 F (37.2 C)  Weight: 159 lb 14.4 oz (72.53 kg)    Fetal Status: Fetal Heart Rate (bpm): 150 Fundal Height: 25 cm Movement: Present     General:  Alert, oriented and cooperative. Patient is in no acute distress.  Skin: Skin is warm and dry. No rash noted.   Cardiovascular: Normal heart rate noted  Respiratory: Normal respiratory effort, no problems with respiration noted  Abdomen: Soft, gravid, appropriate for gestational age. Pain/Pressure: Present     Pelvic: Vag. Bleeding: None Vag D/C Character: Other (Comment)   Cervical exam deferred        Extremities: Normal range of motion.     Mental Status: Normal mood and affect. Normal behavior. Normal judgment and thought content.   Urinalysis:      Assessment and Plan:  Pregnancy: G5P0131 at 921w3d  1. Supervision of high risk pregnancy, antepartum, second trimester Short cervix US tomorrow Preterm labor symptoms and general obstetric precautions including but not limited to vaginal bleeding, contractions, leaking of fluid and fetal movement were reviewed in detail with the patient. Please refer to After Visit Summary for other counseling recommendations.  Return in about 2 weeks (around 07/17/2015).   Adam PhenixJames G Arnold, MD

## 2015-07-03 NOTE — Patient Instructions (Signed)

## 2015-07-04 ENCOUNTER — Encounter (HOSPITAL_COMMUNITY): Payer: Self-pay | Admitting: *Deleted

## 2015-07-04 ENCOUNTER — Inpatient Hospital Stay (HOSPITAL_COMMUNITY)
Admission: AD | Admit: 2015-07-04 | Discharge: 2015-08-07 | DRG: 775 | Disposition: A | Discharge status: Home / Self Care | Payer: Medicaid Other | Source: Ambulatory Visit | Attending: Obstetrics & Gynecology | Admitting: Obstetrics & Gynecology

## 2015-07-04 ENCOUNTER — Encounter (HOSPITAL_COMMUNITY): Payer: Self-pay

## 2015-07-04 ENCOUNTER — Ambulatory Visit (HOSPITAL_COMMUNITY)
Admit: 2015-07-04 | Discharge: 2015-07-04 | Disposition: A | Discharge status: Home / Self Care | Payer: Medicaid Other | Attending: Family Medicine | Admitting: Family Medicine

## 2015-07-04 VITALS — BP 113/77 | HR 98 | Wt 159.5 lb

## 2015-07-04 DIAGNOSIS — E669 Obesity, unspecified: Secondary | ICD-10-CM | POA: Diagnosis present

## 2015-07-04 DIAGNOSIS — O99112 Other diseases of the blood and blood-forming organs and certain disorders involving the immune mechanism complicating pregnancy, second trimester: Secondary | ICD-10-CM | POA: Diagnosis not present

## 2015-07-04 DIAGNOSIS — O34211 Maternal care for low transverse scar from previous cesarean delivery: Secondary | ICD-10-CM | POA: Diagnosis present

## 2015-07-04 DIAGNOSIS — Z3A27 27 weeks gestation of pregnancy: Secondary | ICD-10-CM | POA: Diagnosis not present

## 2015-07-04 DIAGNOSIS — Z98891 History of uterine scar from previous surgery: Secondary | ICD-10-CM | POA: Diagnosis not present

## 2015-07-04 DIAGNOSIS — Z832 Family history of diseases of the blood and blood-forming organs and certain disorders involving the immune mechanism: Secondary | ICD-10-CM

## 2015-07-04 DIAGNOSIS — O42919 Preterm premature rupture of membranes, unspecified as to length of time between rupture and onset of labor, unspecified trimester: Secondary | ICD-10-CM | POA: Diagnosis not present

## 2015-07-04 DIAGNOSIS — Z3A24 24 weeks gestation of pregnancy: Secondary | ICD-10-CM | POA: Diagnosis not present

## 2015-07-04 DIAGNOSIS — O99212 Obesity complicating pregnancy, second trimester: Secondary | ICD-10-CM

## 2015-07-04 DIAGNOSIS — Z3A29 29 weeks gestation of pregnancy: Secondary | ICD-10-CM

## 2015-07-04 DIAGNOSIS — Z6829 Body mass index (BMI) 29.0-29.9, adult: Secondary | ICD-10-CM

## 2015-07-04 DIAGNOSIS — O34219 Maternal care for unspecified type scar from previous cesarean delivery: Secondary | ICD-10-CM

## 2015-07-04 DIAGNOSIS — Z3A26 26 weeks gestation of pregnancy: Secondary | ICD-10-CM | POA: Diagnosis not present

## 2015-07-04 DIAGNOSIS — Z3490 Encounter for supervision of normal pregnancy, unspecified, unspecified trimester: Secondary | ICD-10-CM

## 2015-07-04 DIAGNOSIS — O99214 Obesity complicating childbirth: Secondary | ICD-10-CM | POA: Diagnosis present

## 2015-07-04 DIAGNOSIS — O26872 Cervical shortening, second trimester: Secondary | ICD-10-CM

## 2015-07-04 DIAGNOSIS — O42912 Preterm premature rupture of membranes, unspecified as to length of time between rupture and onset of labor, second trimester: Secondary | ICD-10-CM | POA: Diagnosis not present

## 2015-07-04 DIAGNOSIS — Z3A25 25 weeks gestation of pregnancy: Secondary | ICD-10-CM | POA: Diagnosis not present

## 2015-07-04 DIAGNOSIS — M329 Systemic lupus erythematosus, unspecified: Secondary | ICD-10-CM | POA: Diagnosis present

## 2015-07-04 DIAGNOSIS — O3432 Maternal care for cervical incompetence, second trimester: Secondary | ICD-10-CM | POA: Diagnosis not present

## 2015-07-04 DIAGNOSIS — Z7982 Long term (current) use of aspirin: Secondary | ICD-10-CM

## 2015-07-04 DIAGNOSIS — R739 Hyperglycemia, unspecified: Secondary | ICD-10-CM | POA: Diagnosis not present

## 2015-07-04 DIAGNOSIS — O99810 Abnormal glucose complicating pregnancy: Secondary | ICD-10-CM | POA: Diagnosis not present

## 2015-07-04 DIAGNOSIS — O26879 Cervical shortening, unspecified trimester: Secondary | ICD-10-CM | POA: Diagnosis present

## 2015-07-04 LAB — TYPE AND SCREEN
ABO/RH(D): O POS
Antibody Screen: NEGATIVE

## 2015-07-04 LAB — URINALYSIS, ROUTINE W REFLEX MICROSCOPIC
Bilirubin Urine: NEGATIVE
GLUCOSE, UA: NEGATIVE mg/dL
KETONES UR: NEGATIVE mg/dL
Nitrite: NEGATIVE
PROTEIN: NEGATIVE mg/dL
Specific Gravity, Urine: 1.02 (ref 1.005–1.030)
Urobilinogen, UA: 0.2 mg/dL (ref 0.0–1.0)
pH: 6 (ref 5.0–8.0)

## 2015-07-04 LAB — URINE MICROSCOPIC-ADD ON

## 2015-07-04 LAB — CBC
HCT: 33.7 % — ABNORMAL LOW (ref 36.0–46.0)
Hemoglobin: 11.1 g/dL — ABNORMAL LOW (ref 12.0–15.0)
MCH: 25.5 pg — ABNORMAL LOW (ref 26.0–34.0)
MCHC: 32.9 g/dL (ref 30.0–36.0)
MCV: 77.5 fL — ABNORMAL LOW (ref 78.0–100.0)
PLATELETS: 232 10*3/uL (ref 150–400)
RBC: 4.35 MIL/uL (ref 3.87–5.11)
RDW: 13.9 % (ref 11.5–15.5)
WBC: 7 10*3/uL (ref 4.0–10.5)

## 2015-07-04 LAB — AMNISURE RUPTURE OF MEMBRANE (ROM) NOT AT ARMC: AMNISURE: NEGATIVE

## 2015-07-04 MED ORDER — CALCIUM CARBONATE ANTACID 500 MG PO CHEW: 2.0000 | CHEWABLE_TABLET | ORAL | Status: DC | PRN

## 2015-07-04 MED ORDER — DOCUSATE SODIUM 100 MG PO CAPS: 100.0000 mg | ORAL_CAPSULE | Freq: Every day | ORAL | Status: DC

## 2015-07-04 MED ORDER — ACETAMINOPHEN 325 MG PO TABS
650.0000 mg | ORAL_TABLET | ORAL | Status: DC | PRN
Start: 2015-07-04 — End: 2015-08-05
  Administered 2015-07-14 – 2015-07-18 (×2): 650 mg via ORAL
  Filled 2015-07-04 (×2): qty 2

## 2015-07-04 MED ORDER — ASPIRIN 81 MG PO CHEW
81.0000 mg | CHEWABLE_TABLET | Freq: Every day | ORAL | Status: DC
  Administered 2015-07-04 – 2015-08-04 (×32): 81 mg via ORAL
  Filled 2015-07-04 (×32): qty 1

## 2015-07-04 MED ORDER — PRENATAL MULTIVITAMIN CH: 1.0000 | ORAL_TABLET | Freq: Every day | ORAL | Status: DC

## 2015-07-04 MED ORDER — ZOLPIDEM TARTRATE 5 MG PO TABS: 5.0000 mg | ORAL_TABLET | Freq: Every evening | ORAL | Status: DC | PRN

## 2015-07-04 MED ORDER — NITROFURANTOIN MONOHYD MACRO 100 MG PO CAPS
100.0000 mg | ORAL_CAPSULE | Freq: Two times a day (BID) | ORAL | Status: DC
  Administered 2015-07-04 – 2015-07-06 (×4): 100 mg via ORAL
  Filled 2015-07-04 (×10): qty 1

## 2015-07-04 MED ORDER — ACETAMINOPHEN 325 MG PO TABS: 650.0000 mg | ORAL_TABLET | ORAL | Status: DC | PRN

## 2015-07-04 MED ORDER — CITALOPRAM HYDROBROMIDE 20 MG PO TABS
20.0000 mg | ORAL_TABLET | Freq: Every day | ORAL | Status: DC
  Administered 2015-07-04 – 2015-08-04 (×32): 20 mg via ORAL
  Filled 2015-07-04 (×32): qty 1

## 2015-07-04 MED ORDER — ZOLPIDEM TARTRATE 5 MG PO TABS
5.0000 mg | ORAL_TABLET | Freq: Every evening | ORAL | Status: DC | PRN
  Administered 2015-07-04 – 2015-08-04 (×32): 5 mg via ORAL
  Filled 2015-07-04 (×32): qty 1

## 2015-07-04 MED ORDER — DOCUSATE SODIUM 100 MG PO CAPS
100.0000 mg | ORAL_CAPSULE | Freq: Every day | ORAL | Status: DC
  Administered 2015-07-04 – 2015-08-04 (×31): 100 mg via ORAL
  Filled 2015-07-04 (×31): qty 1

## 2015-07-04 MED ORDER — CALCIUM CARBONATE ANTACID 500 MG PO CHEW
2.0000 | CHEWABLE_TABLET | ORAL | Status: DC | PRN
  Administered 2015-07-23 – 2015-07-31 (×3): 400 mg via ORAL
  Filled 2015-07-04 (×3): qty 2

## 2015-07-04 MED ORDER — HYDROXYCHLOROQUINE SULFATE 200 MG PO TABS
400.0000 mg | ORAL_TABLET | Freq: Every day | ORAL | Status: DC
  Administered 2015-07-05 – 2015-08-04 (×31): 400 mg via ORAL
  Filled 2015-07-04 (×31): qty 2

## 2015-07-04 MED ORDER — PROGESTERONE MICRONIZED 200 MG PO CAPS
200.0000 mg | ORAL_CAPSULE | Freq: Every day | ORAL | Status: DC
  Administered 2015-07-04 – 2015-07-16 (×13): 200 mg via VAGINAL
  Filled 2015-07-04 (×13): qty 1

## 2015-07-04 MED ORDER — HYDROXYCHLOROQUINE SULFATE 200 MG PO TABS
200.0000 mg | ORAL_TABLET | Freq: Once | ORAL | Status: AC
  Administered 2015-07-04: 200 mg via ORAL
  Filled 2015-07-04: qty 1

## 2015-07-04 MED ORDER — NITROFURANTOIN MACROCRYSTAL 100 MG PO CAPS: 100.0000 mg | ORAL_CAPSULE | Freq: Two times a day (BID) | ORAL | Status: DC

## 2015-07-04 MED ORDER — PRENATAL MULTIVITAMIN CH
1.0000 | ORAL_TABLET | Freq: Every day | ORAL | Status: DC
  Administered 2015-07-04 – 2015-08-04 (×31): 1 via ORAL
  Filled 2015-07-04 (×31): qty 1

## 2015-07-04 MED ORDER — HYDROXYCHLOROQUINE SULFATE 200 MG PO TABS
200.0000 mg | ORAL_TABLET | Freq: Every day | ORAL | Status: DC
  Administered 2015-07-04: 200 mg via ORAL
  Filled 2015-07-04: qty 1

## 2015-07-04 NOTE — Consult Note (Signed)
Neonatology Consult to Antenatal Patient:  I was asked by Dr. Debroah LoopArnold to see this patient in order to provide antenatal counseling due to short cervix.  Ms. Anne Mcdowell was admitted again today at 24 4/[redacted] weeks GA. She is currently not having active labor. She got BMZ on 10/6-7 and is on Progesterone. She has a pessary. The baby is a female. Dr. Leary RocaEhrmann spoke with her on her last admission.  I spoke with the patient alone. We discussed the worst case of delivery in the next few days, including usual DR management, possible respiratory complications and need for support, IV access, feedings (mother desires breast feeding, which was encouraged), LOS, Mortality and Morbidity, and long term outcomes. We talked about improvement in survival and outcome for the baby as GA advances. She had a few questions, which I answered. I offered a NICU tour to any interested family members and would be glad to come back if she has more questions later.  Thank you for asking me to see this patient.  Anne Souhristie C. Anne Zervas, MD Neonatologist  The total length of face-to-face or floor/unit time for this encounter was 25 minutes. Counseling and/or coordination of care was 15 minutes of the above.

## 2015-07-04 NOTE — ED Notes (Signed)
Patient states she has been leaking what she feels is urine. States is smells like urine, and requiring her to wear a pad.  States she was seen by provider yesterday and discussed and no further workup was done.

## 2015-07-04 NOTE — H&P (Signed)
Faculty Practice Antenatal History and Physical  Anne Mcdowell JXB:[id number] DOB: Dec 18, 1985 DOA: 07/04/2015  Chief Complaint: pressure, occasional discharge  HPI: Anne Mcdowell is a 29 y.o. female [phone] with IUP at [redacted]w[redacted]d presenting for observation after [place] today showed no measurable cervix with pessary in place. She occasionally note vaginal discharge or possible leakage of urine.    Review of Systems:   Pt complains of vaginal discharge or leaking Good fetal movement, no contractions No vaginal bleeding No fever No dysuria.  Pt denies any bleeding.  Review of systems are otherwise negative  Prenatal History/Complications: Patient Active Problem List   Diagnosis Date Noted  . Pregnancy 07/04/2015  . Short cervix, antepartum 07/04/2015  . Short cervical length during pregnancy 06/26/2015  . Obesity affecting pregnancy, antepartum 05/01/2015  . Previous cesarean section 05/01/2015  . Sickle cell trait (HCC) 04/07/2015  . Supervision of high risk pregnancy, antepartum 04/03/2015  . Disseminated lupus erythematosus (HCC) 05/31/2012     Past Medical History: Past Medical History  Diagnosis Date  . Lupus (HCC)   . Genital warts     Past Surgical History: Past Surgical History  Procedure Laterality Date  . Cholecystectomy    . Cesarean section    . Elective abortion      Obstetrical History: OB History    Gravida Para Term Preterm AB TAB SAB Ectopic Multiple Living   5 1 0 Gynecological History:   Social History: Social History   Social History  . Marital Status: Single    Spouse Name: N/A  . Number of Children: N/A  . Years of Education: N/A   Social History Main Topics  . Smoking status: Never Smoker   . Smokeless tobacco: Never Used  . Alcohol Use: No  . Drug Use: No  . Sexual Activity: No   Other Topics Concern  . None   Social History Narrative    Family History: No family history on file.  Allergies: Allergies   Allergen Reactions  . Penicillins Hives and Swelling    Has patient had a PCN reaction causing immediate rash, facial/tongue/throat swelling, SOB or lightheadedness with hypotension: Yes Has patient had a PCN reaction causing severe rash involving mucus membranes or skin necrosis: No Has patient had a PCN reaction that required hospitalization No Has patient had a PCN reaction occurring within the last 10 years: No If all of the above answers are "NO", then may proceed with Cephalosporin use.  . Vancomycin Hives and Swelling    Prescriptions prior to admission  Medication Sig Dispense Refill Last Dose  . acetaminophen (TYLENOL) 500 MG tablet Take 500 mg by mouth every 6 (six) hours as needed for headache.   Past Week at Unknown time  . aspirin 81 MG tablet Take 81 mg by mouth daily.   07/03/2015 at Unknown time  . calcium carbonate (TUMS - DOSED IN MG ELEMENTAL CALCIUM) 500 MG chewable tablet Chew 2 tablets by mouth daily as needed for indigestion or heartburn.   Past Month at Unknown time  . citalopram (CELEXA) 20 MG tablet Take 20 mg by mouth at bedtime.    07/03/2015 at Unknown time  . hydroxychloroquine (PLAQUENIL) 200 MG tablet Take 400 mg by mouth at bedtime.   07/03/2015 at Unknown time  . nitrofurantoin (MACRODANTIN) 100 MG capsule Take 100 mg by mouth 2 (two) times daily.   07/03/2015 at Unknown time  . Prenatal Vit-Fe Fumarate-FA (  PRENATAL MULTIVITAMIN) TABS tablet Take 1 tablet by mouth daily at 12 noon. 30 tablet 6 06/28/2015 at Unknown time  . progesterone (PROMETRIUM) 200 MG capsule Place 1 capsule (200 mg total) vaginally at bedtime. 30 capsule 3 07/03/2015 at Unknown time    Physical Exam: BP 115/62 mmHg  Pulse 89  Temp(Src) 98.9 F (37.2 C) (Oral)  Resp 18  Ht 5' (1.524 m)  Wt 72.349 kg (159 lb 8 oz)  BMI 31.15 kg/m2  LMP 02/04/2015  General appearance: alert, cooperative and no distress Lungs: normal effort Abdomen: gravid c/w dates, not tender Pelvic: external  genitalia normal and slight white discharge, no pool of fluid, membranes visible intact  Extremities: extremities normal, atraumatic, no cyanosis or edema breech and by US today  None             Labs on Admission:  Basic Metabolic Panel: No results for input(s): NA, K, CL, CO2, GLUCOSE, BUN, CREATININE, CALCIUM, MG, PHOS in the last 168 hours. Liver Function Tests: No results for input(s): AST, ALT, ALKPHOS, BILITOT, PROT, ALBUMIN in the last 168 hours. No results for input(s): LIPASE, AMYLASE in the last 168 hours. No results for input(s): AMMONIA in the last 168 hours. CBC: No results for input(s): WBC, NEUTROABS, HGB, HCT, MCV, PLT in the last 168 hours.  CBG: No results for input(s): GLUCAP in the last 168 hours. Amnisure negative Radiological Exams on Admission: [place] Mfm Ob Transvaginal  07/04/2015  OBSTETRICAL ULTRASOUND: This exam was performed within a [facility]. The OB US report was generated in the AS system, and faxed to the ordering physician.  This report is available in the [employer] PACS. See the [facility] Obstetric US report via the Image Link.    Assessment/Plan Present on Admission:  . Short cervix, antepartum   Code Status: full    Disposition Plan: admission for observation with no measurable cervix on US and normal AFI, breech. No evidence of ruptured membranes. She has received betamethasone and does not require additional dose. Continue progesterone and keep pessary for now. Dr Claudean SeveranceWhitecar recommends observation for at least 2 weeks.  Time spent: 25 min  Adam PhenixJames G Obryan Radu, MD 07/04/2015 12:50 PM Faculty Practice Attending Physician [facility] Attending Phone #: [phone]  **Disclaimer: This note may have been dictated with voice recognition software. Similar sounding words can inadvertently be transcribed and this note may contain transcription errors which may not have been corrected upon publication of  note.**

## 2015-07-04 NOTE — Progress Notes (Signed)
Speculum exam performed  by Dr. Debroah LoopArnold to eval for pt c/o leaking fluid.  Fern specimen & Amnisure specimens obtained.

## 2015-07-05 DIAGNOSIS — O42912 Preterm premature rupture of membranes, unspecified as to length of time between rupture and onset of labor, second trimester: Secondary | ICD-10-CM

## 2015-07-05 DIAGNOSIS — O26872 Cervical shortening, second trimester: Secondary | ICD-10-CM

## 2015-07-05 NOTE — Progress Notes (Signed)
FACULTY PRACTICE ANTEPARTUM(COMPREHENSIVE) NOTE  Anne Mcdowell is a 29 y.o. [phone] at [redacted]w[redacted]d by early ultrasound who is admitted for short cervix and dilation.   Fetal presentation is breech. Length of Stay:  1  Days  Subjective:  Patient reports the fetal movement as active. Patient reports uterine contraction  activity as none. Patient reports  vaginal bleeding as none. Patient describes fluid per vagina as None.  Vitals:  Blood pressure 109/50, pulse 81, temperature 98.5 F (36.9 C), temperature source Oral, resp. rate 18, height 5' (1.524 m), weight 72.349 kg (159 lb 8 oz), last menstrual period 02/04/2015. Physical Examination:  General appearance - alert, well appearing, and in no distress Heart - normal rate and regular rhythm Abdomen - soft, nontender, nondistended Fundal Height:  size equals dates Cervical Exam: Not evaluated.  Extremities: extremities normal, atraumatic, no cyanosis or edema  Membranes:intact Negative amnisure 07/04/15 Fetal Monitoring:     Fetal Heart Rate A      Mode  External filed at 07/04/2015 2113    Baseline Rate (A)  150 bpm filed at 07/04/2015 2200    Variability  6-25 BPM filed at 07/04/2015 2200    Accelerations  10 x 10 filed at 07/04/2015 2200    Decelerations  Variable filed at 07/04/2015 2200        Labs:  Results for orders placed or performed during the hospital encounter of 07/04/15 (from the past 24 hour(s))  Amnisure rupture of membrane (rom)not at [facility]   Collection Time: 07/04/15 12:32 PM  Result Value Ref Range   Amnisure ROM NEGATIVE   CBC   Collection Time: 07/04/15 12:40 PM  Result Value Ref Range   WBC 7.0 4.0 - 10.5 K/uL   RBC 4.35 3.87 - 5.11 MIL/uL   Hemoglobin 11.1 (L) 12.0 - 15.0 g/dL   HCT 78.4 (L) 69.6 - 29.5 %   MCV 77.5 (L) 78.0 - 100.0 fL   MCH 25.5 (L) 26.0 - 34.0 pg   MCHC 32.9 30.0 - 36.0 g/dL   RDW 28.4 13.2 - 44.0 %   Platelets 232 150 - 400 K/uL  Type and screen [facility]     Collection Time: 07/04/15 12:40 PM  Result Value Ref Range   ABO/RH(D) O POS    Antibody Screen NEG    Sample Expiration 07/07/2015   Urinalysis, Routine w reflex microscopic (not at [facility])   Collection Time: 07/04/15  1:08 PM  Result Value Ref Range   Color, Urine YELLOW YELLOW   APPearance CLEAR CLEAR   Specific Gravity, Urine 1.020 1.005 - 1.030   pH 6.0 5.0 - 8.0   Glucose, UA NEGATIVE NEGATIVE mg/dL   Hgb urine dipstick TRACE (A) NEGATIVE   Bilirubin Urine NEGATIVE NEGATIVE   Ketones, ur NEGATIVE NEGATIVE mg/dL   Protein, ur NEGATIVE NEGATIVE mg/dL   Urobilinogen, UA 0.2 0.0 - 1.0 mg/dL   Nitrite NEGATIVE NEGATIVE   Leukocytes, UA SMALL (A) NEGATIVE  Urine microscopic-add on   Collection Time: 07/04/15  1:08 PM  Result Value Ref Range   Squamous Epithelial / LPF FEW (A) RARE   WBC, UA 0-2 <3 WBC/hpf   RBC / HPF 0-2 <3 RBC/hpf   Bacteria, UA RARE RARE     Medications:  Scheduled . aspirin  81 mg Oral QHS  . citalopram  20 mg Oral QHS  . docusate sodium  100 mg Oral Daily  . hydroxychloroquine  400 mg Oral QHS  . nitrofurantoin (macrocrystal-monohydrate)  100  mg Oral Q12H  . prenatal multivitamin  1 tablet Oral Q1200  . progesterone  200 mg Vaginal QHS   I have reviewed the patient's current medications.  ASSESSMENT: Patient Active Problem List   Diagnosis Date Noted  . Pregnancy 07/04/2015  . Short cervix, antepartum 07/04/2015  . Short cervical length during pregnancy 06/26/2015  . Obesity affecting pregnancy, antepartum 05/01/2015  . Previous cesarean section 05/01/2015  . Sickle cell trait (HCC) 04/07/2015  . Supervision of high risk pregnancy, antepartum 04/03/2015  . Disseminated lupus erythematosus (HCC) 05/31/2012    PLAN: Prometrium per vagina Limited activity Pessary left in place  Anne Mcdowell 07/05/2015,11:39 AM

## 2015-07-06 DIAGNOSIS — O3432 Maternal care for cervical incompetence, second trimester: Secondary | ICD-10-CM

## 2015-07-06 DIAGNOSIS — Z3A24 24 weeks gestation of pregnancy: Secondary | ICD-10-CM

## 2015-07-06 DIAGNOSIS — O26872 Cervical shortening, second trimester: Secondary | ICD-10-CM

## 2015-07-06 LAB — CULTURE, BETA STREP (GROUP B ONLY)

## 2015-07-06 NOTE — Progress Notes (Signed)
FACULTY PRACTICE ANTEPARTUM(COMPREHENSIVE) NOTE  Anne Mcdowell is a 29 y.o. [phone] at 6669w6d by early ultrasound who is admitted for incompetent cervix.   Fetal presentation is breech. Length of Stay:  2  Days  Subjective:  Patient reports the fetal movement as active. Patient reports uterine contraction  activity as none. Patient reports  vaginal bleeding as none. Patient describes fluid per vagina as None.  Vitals:  Blood pressure 119/56, pulse 96, temperature 98.8 F (37.1 C), temperature source Oral, resp. rate 16, height 5' (1.524 m), weight 72.349 kg (159 lb 8 oz), last menstrual period 02/04/2015. Physical Examination:  General appearance - alert, well appearing, and in no distress Heart - normal rate and regular rhythm Abdomen - soft, nontender, nondistended Fundal Height:  size equals dates Cervical Exam: Not evaluated.. Extremities: extremities normal, atraumatic, no cyanosis or edema and Homans sign is negative, no sign of DVT Membranes:intact  Fetal Monitoring:     Fetal Heart Rate A      Mode  External filed at 07/05/2015 2235    Baseline Rate (A)  155 bpm filed at 07/05/2015 2235    Variability  6-25 BPM filed at 07/05/2015 2235    Accelerations  15 x 15 filed at 07/05/2015 1845    Decelerations  Variable filed at 07/05/2015 2235        Labs:  No results found for this or any previous visit (from the past 24 hour(s)).    Medications:  Scheduled . aspirin  81 mg Oral QHS  . citalopram  20 mg Oral QHS  . docusate sodium  100 mg Oral Daily  . hydroxychloroquine  400 mg Oral QHS  . nitrofurantoin (macrocrystal-monohydrate)  100 mg Oral Q12H  . prenatal multivitamin  1 tablet Oral Q1200  . progesterone  200 mg Vaginal QHS   I have reviewed the patient's current medications.  ASSESSMENT: Patient Active Problem List   Diagnosis Date Noted  . Pregnancy 07/04/2015  . Short cervix, antepartum 07/04/2015  . Short cervical length during pregnancy  06/26/2015  . Obesity affecting pregnancy, antepartum 05/01/2015  . Previous cesarean section 05/01/2015  . Sickle cell trait (HCC) 04/07/2015  . Supervision of high risk pregnancy, antepartum 04/03/2015  . Disseminated lupus erythematosus (HCC) 05/31/2012    PLAN: Continue observation in hospital for s/sx PTL  ARNOLD,JAMES 07/06/2015,7:49 AM

## 2015-07-07 LAB — TYPE AND SCREEN
ABO/RH(D): O POS
Antibody Screen: NEGATIVE

## 2015-07-07 NOTE — Progress Notes (Signed)
FACULTY PRACTICE ANTEPARTUM(COMPREHENSIVE) NOTE  Anne Mcdowell is a 29 y.o. [phone] at 543w0d by early ultrasound who is admitted for incompetent cervix.   Fetal presentation is breech. Length of Stay:  3  Days  Subjective:  Patient reports the fetal movement as active. Patient reports uterine contraction  activity as none. Patient reports  vaginal bleeding as none. Patient describes fluid per vagina as None.  Vitals:  Blood pressure 102/58, pulse 87, temperature 98.5 F (36.9 C), temperature source Oral, resp. rate 18, height 5' (1.524 m), weight 159 lb 8 oz (72.349 kg), last menstrual period 02/04/2015, SpO2 100 %. Physical Examination: General appearance - alert, well appearing, and in no distress Heart - normal rate and regular rhythm Abdomen - soft, nontender, nondistended Fundal Height:  size equals dates Cervical Exam: Not evaluated.. Extremities: extremities normal, atraumatic, no cyanosis or edema and Homans sign is negative, no sign of DVT Membranes:intact  Fetal Monitoring:   145 bpm, moderate variability, accelerations present, no concerning decelerations   Labs:  No results found for this or any previous visit (from the past 24 hour(s)).    Medications:  Scheduled . aspirin  81 mg Oral QHS  . citalopram  20 mg Oral QHS  . docusate sodium  100 mg Oral Daily  . hydroxychloroquine  400 mg Oral QHS  . nitrofurantoin (macrocrystal-monohydrate)  100 mg Oral Q12H  . prenatal multivitamin  1 tablet Oral Q1200  . progesterone  200 mg Vaginal QHS   I have reviewed the patient's current medications.  ASSESSMENT: Patient Active Problem List   Diagnosis Date Noted  . Pregnancy 07/04/2015  . Short cervix, antepartum 07/04/2015  . Short cervical length during pregnancy 06/26/2015  . Obesity affecting pregnancy, antepartum 05/01/2015  . Previous cesarean section 05/01/2015  . Sickle cell trait (HCC) 04/07/2015  . Supervision of high risk pregnancy, antepartum  04/03/2015  . Disseminated lupus erythematosus (HCC) 05/31/2012    PLAN: Continue observation in hospital for s/sx PTL, for at least 2 weeks as per MFM Pessary is in place Reassuring fetal status Routine antenatal care  Tyja Gortney A, MD  07/07/2015,7:28 AM

## 2015-07-08 NOTE — Progress Notes (Signed)
FACULTY PRACTICE ANTEPARTUM(COMPREHENSIVE) NOTE  Anne Mcdowell is a 29 y.o. [phone] at 3150w0d by early ultrasound who is admitted for incompetent cervix.   Fetal presentation is breech. Length of Stay:  4  Days  Subjective: No new complaints Patient reports the fetal movement as active. Patient reports uterine contraction  activity as none. Patient reports  vaginal bleeding as none. Patient describes fluid per vagina as None.  Vitals:  Blood pressure 112/67, pulse 93, temperature 99.1 F (37.3 C), temperature source Oral, resp. rate 16, height 5' (1.524 m), weight 159 lb 8 oz (72.349 kg), last menstrual period 02/04/2015, SpO2 100 %. Physical Examination: General appearance - alert, well appearing, and in no distress Heart: regular rate, no murmur Lungs: clear to auscultation bilaterally, no wheezing.  Abdomen - soft, nontender, nondistended Fundal Height:  size equals dates Cervical Exam: Not evaluated.. Extremities: extremities normal, atraumatic, no cyanosis or edema and Homans sign is negative, no sign of DVT Membranes:intact  Fetal Monitoring:   145 bpm, moderate variability, accelerations present, no concerning decelerations   Labs:  Results for orders placed or performed during the hospital encounter of 07/04/15 (from the past 24 hour(s))  Type and screen [facility] [place]   Collection Time: 07/07/15 11:09 AM  Result Value Ref Range   ABO/RH(D) O POS    Antibody Screen NEG    Sample Expiration 07/10/2015       Medications:  Scheduled . aspirin  81 mg Oral QHS  . citalopram  20 mg Oral QHS  . docusate sodium  100 mg Oral Daily  . hydroxychloroquine  400 mg Oral QHS  . nitrofurantoin (macrocrystal-monohydrate)  100 mg Oral Q12H  . prenatal multivitamin  1 tablet Oral Q1200  . progesterone  200 mg Vaginal QHS   I have reviewed the patient's current medications.  ASSESSMENT: Patient Active Problem List   Diagnosis Date Noted  . Short cervix,  antepartum 07/04/2015  . Short cervical length during pregnancy 06/26/2015  . Obesity affecting pregnancy, antepartum 05/01/2015  . Previous cesarean section 05/01/2015  . Sickle cell trait (HCC) 04/07/2015  . Supervision of high risk pregnancy, antepartum 04/03/2015  . Disseminated lupus erythematosus (HCC) 05/31/2012    PLAN: 1.  Shortened Cervix Hospital Day: 4 Continue observation in hospital for s/sx PTL, for at least 2 weeks as per MFM Pessary is in place Reassuring fetal status Routine antenatal care  [name] JEHIEL, DO  07/08/2015,6:49 AM

## 2015-07-09 NOTE — Progress Notes (Signed)
Patient ID: Anne Mcdowell, female   DOB: 05/08/1986, 29 y.o.   MRN: [id number] FACULTY PRACTICE ANTEPARTUM(COMPREHENSIVE) NOTE  Anne Mcdowell is a 29 y.o. [phone] at 6864w2d by early ultrasound who is admitted for incompetent cervix.   Fetal presentation is breech. Length of Stay:  5  Days  Subjective: Patient reports an episode of left sided abdominal pain in the region of the round ligament. Patient is otherwise without complaints and denies cramping or pelvic pressure Patient reports the fetal movement as active. Patient reports uterine contraction  activity as none. Patient reports  vaginal bleeding as none. Patient describes fluid per vagina as None.  Vitals:  Blood pressure 111/75, pulse 95, temperature 98.7 F (37.1 C), temperature source Oral, resp. rate 16, height 5' (1.524 m), weight 159 lb 8 oz (72.349 kg), last menstrual period 02/04/2015, SpO2 100 %. Physical Examination: General appearance - alert, well appearing, and in no distress Heart: regular rate, no murmur Lungs: clear to auscultation bilaterally, no wheezing.  Abdomen - soft, nontender, gravid Fundal Height:  size equals dates Cervical Exam: Not evaluated.. Extremities: extremities normal, atraumatic, no cyanosis or edema and Homans sign is negative, no sign of DVT Membranes:intact  Fetal Monitoring:   150 bpm, moderate variability, accelerations present, no concerning decelerations  Toco: no contractions  Labs:  No results found for this or any previous visit (from the past 24 hour(s)).    Medications:  Scheduled . aspirin  81 mg Oral QHS  . citalopram  20 mg Oral QHS  . docusate sodium  100 mg Oral Daily  . hydroxychloroquine  400 mg Oral QHS  . nitrofurantoin (macrocrystal-monohydrate)  100 mg Oral Q12H  . prenatal multivitamin  1 tablet Oral Q1200  . progesterone  200 mg Vaginal QHS   I have reviewed the patient's current medications.  ASSESSMENT: Patient Active Problem List   Diagnosis Date  Noted  . Short cervix, antepartum 07/04/2015  . Short cervical length during pregnancy 06/26/2015  . Obesity affecting pregnancy, antepartum 05/01/2015  . Previous cesarean section 05/01/2015  . Sickle cell trait (HCC) 04/07/2015  . Supervision of high risk pregnancy, antepartum 04/03/2015  . Disseminated lupus erythematosus (HCC) 05/31/2012    PLAN: 1.  Shortened Cervix Hospital Day: 5 Continue observation in hospital for s/sx PTL, for at least 2 weeks as per MFM Cervical pessary is in place Continue vaginal prometrium Reassuring fetal status Routine antenatal care  Thurl Boen, MD  07/09/2015,6:31 AM

## 2015-07-09 NOTE — Progress Notes (Signed)
I spent time with Anne Mcdowell.  She appeared in good spirits and reported that she has good support.  She spoke about her 29 year-old son and how excited he is to be a big brother.  We spoke about how to find meaningful ways to use her time while she is here.  She is interested in some art supplies, which we will bring her tomorrow.   She is aware of our ongoing availability for chaplain support.  Chaplain Dyanne CarrelKaty Rexine Gowens, Bcc Pager, [phone] 4:22 PM    07/09/15 1600  Clinical Encounter Type  Visited With Patient  Visit Type Spiritual support

## 2015-07-10 LAB — CBC
HCT: 30.4 % — ABNORMAL LOW (ref 36.0–46.0)
Hemoglobin: 9.9 g/dL — ABNORMAL LOW (ref 12.0–15.0)
MCH: 25.3 pg — ABNORMAL LOW (ref 26.0–34.0)
MCHC: 32.6 g/dL (ref 30.0–36.0)
MCV: 77.6 fL — ABNORMAL LOW (ref 78.0–100.0)
PLATELETS: 165 10*3/uL (ref 150–400)
RBC: 3.92 MIL/uL (ref 3.87–5.11)
RDW: 14.4 % (ref 11.5–15.5)
WBC: 5.6 10*3/uL (ref 4.0–10.5)

## 2015-07-10 LAB — TYPE AND SCREEN
ABO/RH(D): O POS
ANTIBODY SCREEN: NEGATIVE

## 2015-07-10 NOTE — Progress Notes (Signed)
Initial Nutrition Assessment  DOCUMENTATION CODES:   Not applicable  INTERVENTION:  Regular diet May order double protein portions, snacks TID and from retail  NUTRITION DIAGNOSIS:   Increased nutrient needs related to  (pregnancy and fetal growth requirements) as evidenced by  ([redacted] weeks pregnant).  GOAL:   Patient will meet greater than or equal to 90% of their needs  MONITOR:   Weight trends  REASON FOR ASSESSMENT:   Antenatal    ASSESSMENT:   25 3/7 weeks with short cervix. N/V early in pregnancy with weight loss. Now tolerating diet well with good appetitie. Made provision for pt to order snacks TID, double protein portions  Diet Order:  Diet regular Room service appropriate?: Yes; Fluid consistency:: Thin  Skin:  Reviewed, no issues  Height:   Ht Readings from Last 1 Encounters:  07/04/15 5' (1.524 m)   Weight:   Wt Readings from Last 1 Encounters:  07/09/15 161 lb 14.4 oz (73.437 kg)  usual weight 174 lbs  Ideal Body Weight:  45.5 kg  BMI:  Body mass index is 31.62 kg/(m^2). pre-pregnancy BMI 34, goal weight gain 11-20 lbs  Estimated Nutritional Needs:   Kcal:  1900-2100  Protein:  80-90 g  Fluid:  2.2 L  EDUCATION NEEDS:   No education needs identified at this time  Inez PilgrimKatherine Pearlean Sabina M.Odis LusterEd. R.D. LDN Neonatal Nutrition Support Specialist/RD III Pager [phone]      Phone [phone]

## 2015-07-10 NOTE — Progress Notes (Signed)
Patient c/o lower abdominal pressure.

## 2015-07-10 NOTE — Progress Notes (Signed)
Patient ID: Anne Mcdowell, female   DOB: 05/28/1986, 29 y.o.   MRN: [id number] FACULTY PRACTICE ANTEPARTUM(COMPREHENSIVE) NOTE  Anne Ballslicia C Karow is a 29 y.o. [phone] at 8446w3d  who is admitted for incompetent cervix.   Length of Stay:  6  Days  Subjective: Pt reports increase pressure low midline abdomen. Patient reports the fetal movement as active. Patient reports uterine contraction  activity as none. Patient reports  vaginal bleeding as none. Patient describes fluid per vagina as None.  Vitals:  Blood pressure 112/63, pulse 94, temperature 98.9 F (37.2 C), temperature source Oral, resp. rate 18, height 5' (1.524 m), weight 161 lb 14.4 oz (73.437 kg), last menstrual period 02/04/2015, SpO2 100 %. Physical Examination:  General appearance - alert, well appearing, and in no distress Abdomen - soft, nontender, no rebound Extremities - Homan's sign negative bilaterally  Fetal Monitoring:  Baseline: 140 bpm, Variability: Good {> 6 bpm), Accelerations: Non-reactive but appropriate for gestational age and Decelerations: Absent  Labs:  No results found for this or any previous visit (from the past 24 hour(s)).   Medications:  Scheduled . aspirin  81 mg Oral QHS  . citalopram  20 mg Oral QHS  . docusate sodium  100 mg Oral Daily  . hydroxychloroquine  400 mg Oral QHS  . prenatal multivitamin  1 tablet Oral Q1200  . progesterone  200 mg Vaginal QHS   I have reviewed the patient's current medications.  ASSESSMENT: Patient Active Problem List   Diagnosis Date Noted  . Short cervix, antepartum 07/04/2015  . Short cervical length during pregnancy 06/26/2015  . Obesity affecting pregnancy, antepartum 05/01/2015  . Previous cesarean section 05/01/2015  . Sickle cell trait (HCC) 04/07/2015  . Supervision of high risk pregnancy, antepartum 04/03/2015  . Disseminated lupus erythematosus (HCC) 05/31/2012    PLAN: 29 yo female with short cervix, HCC, lupus here for 2 weeks observation  for risk of delivery.  1-FHT--initial decreased variability but became reassuring. 2-CBC 3-Continue progesterone   Edmond Ginsberg H. 07/10/2015,9:20 AM

## 2015-07-10 NOTE — Progress Notes (Signed)
Follow up visit with patient who was on the phone.  Brought art supplies for patient.  Will continue to follow.     07/10/15 1552  Clinical Encounter Type  Visited With Patient  Visit Type Follow-up  Referral From Chaplain

## 2015-07-11 DIAGNOSIS — Z3A25 25 weeks gestation of pregnancy: Secondary | ICD-10-CM

## 2015-07-11 NOTE — Progress Notes (Signed)
FACULTY PRACTICE ANTEPARTUM(COMPREHENSIVE) NOTE  Anne Mcdowell is a 29 y.o. [phone] at 5224w4d by early ultrasound who is admitted for incompetent cervix with pessary .   Fetal presentation is breech. Length of Stay:  7  Days  Subjective: Felt low abdominal pressure which has improved Patient reports the fetal movement as active. Patient reports uterine contraction  activity as none. Patient reports  vaginal bleeding as none. Patient describes fluid per vagina as None.  Vitals:  Blood pressure 112/66, pulse 96, temperature 98.7 F (37.1 C), temperature source Oral, resp. rate 18, height 5' (1.524 m), weight 161 lb 14.4 oz (73.437 kg), last menstrual period 02/04/2015, SpO2 100 %. Physical Examination:  General appearance - alert, well appearing, and in no distress Heart - normal rate and regular rhythm Abdomen - soft, nontender, nondistended Fundal Height:  size equals dates Cervical Exam: Not evaluated.  Extremities: extremities normal, atraumatic, no cyanosis or edema and Homans sign is negative, no sign of DVT  Membranes:intact  Fetal Monitoring:     Fetal Heart Rate A      Mode  External filed at 07/10/2015 2315    Baseline Rate (A)  135 bpm filed at 07/10/2015 2315    Variability  6-25 BPM filed at 07/10/2015 2315    Accelerations  15 x 15 filed at 07/10/2015 2315    Decelerations  None filed at 07/10/2015 2315     No contractions   Labs:  Results for orders placed or performed during the hospital encounter of 07/04/15 (from the past 24 hour(s))  CBC   Collection Time: 07/10/15  9:38 AM  Result Value Ref Range   WBC 5.6 4.0 - 10.5 K/uL   RBC 3.92 3.87 - 5.11 MIL/uL   Hemoglobin 9.9 (L) 12.0 - 15.0 g/dL   HCT 32.430.4 (L) 40.136.0 - 02.746.0 %   MCV 77.6 (L) 78.0 - 100.0 fL   MCH 25.3 (L) 26.0 - 34.0 pg   MCHC 32.6 30.0 - 36.0 g/dL   RDW 25.314.4 66.411.5 - 40.315.5 %   Platelets 165 150 - 400 K/uL  [facility]   Collection Time: 07/10/15  10:55 AM  Result Value Ref Range   ABO/RH(D) O POS    Antibody Screen NEG    Sample Expiration 07/13/2015      Medications:  Scheduled . aspirin  81 mg Oral QHS  . citalopram  20 mg Oral QHS  . docusate sodium  100 mg Oral Daily  . hydroxychloroquine  400 mg Oral QHS  . prenatal multivitamin  1 tablet Oral Q1200  . progesterone  200 mg Vaginal QHS   I have reviewed the patient's current medications.  ASSESSMENT: Patient Active Problem List   Diagnosis Date Noted  . Short cervix, antepartum 07/04/2015  . Short cervical length during pregnancy 06/26/2015  . Obesity affecting pregnancy, antepartum 05/01/2015  . Previous cesarean section 05/01/2015  . Sickle cell trait (HCC) 04/07/2015  . Supervision of high risk pregnancy, antepartum 04/03/2015  . 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

## 2015-07-12 DIAGNOSIS — O42912 Preterm premature rupture of membranes, unspecified as to length of time between rupture and onset of labor, second trimester: Secondary | ICD-10-CM

## 2015-07-12 DIAGNOSIS — O26872 Cervical shortening, second trimester: Secondary | ICD-10-CM

## 2015-07-12 NOTE — Progress Notes (Signed)
Patient ID: Anne Mcdowell, female   DOB: 02/16/1986, 29 y.o.   MRN: [id number] FACULTY PRACTICE ANTEPARTUM(COMPREHENSIVE) NOTE  Anne Mcdowell is a 29 y.o. [phone] at 4462w5d b who is admitted for cervical insufficiency, with visually dilated cervix to 2-3 cm at admit..   Fetal presentation is breech.at admit Length of Stay:  8  Days  Subjective: Pt remains stable, without complaints. Patient reports the fetal movement as active. Patient reports uterine contraction  activity as none. Patient reports  vaginal bleeding as none. Patient describes fluid per vagina as None.  Vitals:  Blood pressure 121/71, pulse 92, temperature 99.2 F (37.3 C), temperature source Oral, resp. rate 18, height 5' (1.524 m), weight 73.437 kg (161 lb 14.4 oz), last menstrual period 02/04/2015, SpO2 100 %. Physical Examination:  General appearance - alert, well appearing, and in no distress Heart - normal rate and regular rhythm Abdomen - soft, nontender, nondistended Fundal Height:  size equals dates Cervical Exam: Not evaluated.  fetal presentation is breech. Extremities: extremities normal, atraumatic, no cyanosis or edema and Homans sign is negative, no sign of DVT with DTRs 2+ bilaterally Membranes:intact  Fetal Monitoring:  Twice daily no labor  Labs:  No results found for this or any previous visit (from the past 24 hour(s)).  Imaging Studies:     Currently EPIC will not allow sonographic studies to automatically populate into notes.  In the meantime, copy and paste results into note or free text.  Medications:  Scheduled . aspirin  81 mg Oral QHS  . citalopram  20 mg Oral QHS  . docusate sodium  100 mg Oral Daily  . hydroxychloroquine  400 mg Oral QHS  . prenatal multivitamin  1 tablet Oral Q1200  . progesterone  200 mg Vaginal QHS   I have reviewed the patient's current medications.  ASSESSMENT: Patient Active Problem List   Diagnosis Date Noted  . Short cervix, antepartum 07/04/2015  .  Short cervical length during pregnancy 06/26/2015  . Obesity affecting pregnancy, antepartum 05/01/2015  . Previous cesarean section 05/01/2015  . Sickle cell trait (HCC) 04/07/2015  . Supervision of high risk pregnancy, antepartum 04/03/2015  . 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 2 wk hospital stay planned  Floretta Petro V 07/12/2015,7:42 AM

## 2015-07-13 NOTE — Progress Notes (Signed)
Patient ID: Anne Ballslicia C Asano, female   DOB: 04/26/1986, 29 y.o.   MRN: [id number] FACULTY PRACTICE ANTEPARTUM(COMPREHENSIVE) NOTE  Anne Mcdowell is a 29 y.o. [phone] at 136w6d by best estimate who is admitted for Preterm labor.   Fetal presentation is breech. Length of Stay:  9  Days  Subjective: Reports some sharp pains in lower abdomen Patient reports the fetal movement as active. Patient reports uterine contraction  activity as irregular, every 5-10 minutes. Patient reports  vaginal bleeding as none. Patient describes fluid per vagina as None.  Vitals:  Blood pressure 117/63, pulse 87, temperature 99.1 F (37.3 C), temperature source Oral, resp. rate 18, height 5' (1.524 m), weight 161 lb 14.4 oz (73.437 kg), last menstrual period 02/04/2015, SpO2 100 %. Physical Examination:  General appearance - alert, well appearing, and in no distress Chest - normal effort Abdomen - gravid, NT Fundal Height:  size equals dates Cervical Exam: Not evaluated. Extremities: Homans sign is negative, no sign of DVT  Membranes:intact  Fetal Monitoring:  Baseline: 150 bpm, Variability: Good {> 6 bpm), Accelerations: Non-reactive but appropriate for gestational age and Decelerations: Absent   Medications:  Scheduled . aspirin  81 mg Oral QHS  . citalopram  20 mg Oral QHS  . docusate sodium  100 mg Oral Daily  . hydroxychloroquine  400 mg Oral QHS  . prenatal multivitamin  1 tablet Oral Q1200  . progesterone  200 mg Vaginal QHS   I have reviewed the patient's current medications.  ASSESSMENT/PLAN: Short cervix with threatened PTL - pessary in place, continue prometrium. S/p BMZ--if pain persists, would need evaluation for possible tocolysis Previous C-section and breech--if labor ensues would need RCS    PRATT,TANYA S, MD 07/13/2015,7:58 AM

## 2015-07-14 DIAGNOSIS — O26872 Cervical shortening, second trimester: Secondary | ICD-10-CM

## 2015-07-14 DIAGNOSIS — O42912 Preterm premature rupture of membranes, unspecified as to length of time between rupture and onset of labor, second trimester: Secondary | ICD-10-CM

## 2015-07-14 NOTE — Plan of Care (Signed)
Problem: Phase I Progression Outcomes Goal: LOS < 4 days Outcome: Not Met (add Reason) Patient admitted to antenatal for prolonged hospitalization.     

## 2015-07-14 NOTE — Progress Notes (Signed)
Patient ID: Anne Mcdowell, female   DOB: 07/30/1986, 29 y.o.   MRN: [id number] FACULTY PRACTICE ANTEPARTUM(COMPREHENSIVE) NOTE  Anne Mcdowell is a 29 y.o. [phone] at 637w0d by early ultrasound who is admitted for advanced cervical dilation, short cervix, not measurable, visually 2-3 cm at admit, with pessary in place.   Fetal presentation is breech. Length of Stay:  10  Days  Subjective: Pt has no complaints , remains stable. Patient reports the fetal movement as active. Patient reports uterine contraction  activity as none. Patient reports  vaginal bleeding as none. Patient describes fluid per vagina as None.  Vitals:  Blood pressure 112/67, pulse 85, temperature 98.7 F (37.1 C), temperature source Oral, resp. rate 16, height 5' (1.524 m), weight 73.437 kg (161 lb 14.4 oz), last menstrual period 02/04/2015, SpO2 100 %. Physical Examination:  General appearance - alert, well appearing, and in no distress, oriented to person, place, and time and normal appearing weight Heart - normal rate and regular rhythm Abdomen - soft, nontender, nondistended Fundal Height:  size equals dates Cervical Exam: Not evaluated. And  Extremities: extremities normal, atraumatic, no cyanosis or edema and Homans sign is negative, no sign of DVT with DTRs 2+ bilaterally Membranes:intact  Fetal Monitoring:  Pending for today  Labs:  Results for orders placed or performed during the hospital encounter of 07/04/15 (from the past 24 hour(s))  [facility] screen [facility]   Collection Time: 07/13/15 11:15 AM  Result Value Ref Range   ABO/RH(D) O POS    Antibody Screen NEG    Sample Expiration 07/16/2015     Imaging Studies:     Currently EPIC will not allow sonographic studies to automatically populate into notes.  In the meantime, copy and paste results into note or free text.  Medications:  Scheduled . aspirin  81 mg Oral QHS  . citalopram  20 mg Oral QHS  . docusate sodium  100 mg  Oral Daily  . hydroxychloroquine  400 mg Oral QHS  . prenatal multivitamin  1 tablet Oral Q1200  . progesterone  200 mg Vaginal QHS   I have reviewed the patient's current medications.  ASSESSMENT: Patient Active Problem List   Diagnosis Date Noted  . Short cervix, antepartum 07/04/2015  . Short cervical length during pregnancy 06/26/2015  . Obesity affecting pregnancy, antepartum 05/01/2015  . Previous cesarean section 05/01/2015  . Sickle cell trait (HCC) 04/07/2015  . Supervision of high risk pregnancy, antepartum 04/03/2015  . 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  PLAN: Will discuss with MFM today , and likely schedule followup with them, and decide on d/c date. Over the 10 days she has been in hospital, there has been no change in pt status., Pt now 26.0 wk.  Anne Mcdowell V 07/14/2015,7:31 AM

## 2015-07-15 DIAGNOSIS — Z98891 History of uterine scar from previous surgery: Secondary | ICD-10-CM

## 2015-07-15 NOTE — Progress Notes (Signed)
Patient ID: Anne Mcdowell, female   DOB: 09/09/1986, 29 y.o.   MRN: [id number] FACULTY PRACTICE ANTEPARTUM(COMPREHENSIVE) NOTE  Anne Mcdowell is a 29 y.o. [phone] at 8522w6d by best estimate who is admitted for Preterm labor.   Fetal presentation is breech. Length of Stay:  11  Days  Subjective: Reports some pressure last night while sleeping, but none now. Patient reports the fetal movement as active. Patient reports uterine contraction  activity as none. Patient reports  vaginal bleeding as none. Patient describes fluid per vagina as None.  Vitals:  Blood pressure 117/70, pulse 85, temperature 98.1 F (36.7 C), temperature source Oral, resp. rate 20, height 5' (1.524 m), weight 161 lb 14.4 oz (73.437 kg), last menstrual period 02/04/2015, SpO2 100 %. Physical Examination:  General appearance - alert, well appearing, and in no distress Heart: regular rate, no murmur Lungs: clear to auscultation bilaterally, no wheezing.  Abdomen - gravid, NT Fundal Height:  size equals dates Cervical Exam: Not evaluated. Extremities: Homans sign is negative, no sign of DVT  Membranes:intact  Fetal Monitoring:  Baseline: 150 bpm, Variability: Good {> 6 bpm), Accelerations: Non-reactive but appropriate for gestational age and Decelerations: Absent   Medications:  Scheduled . aspirin  81 mg Oral QHS  . citalopram  20 mg Oral QHS  . docusate sodium  100 mg Oral Daily  . hydroxychloroquine  400 mg Oral QHS  . prenatal multivitamin  1 tablet Oral Q1200  . progesterone  200 mg Vaginal QHS   I have reviewed the patient's current medications.  ASSESSMENT/PLAN: 1.  Short cervix with threatened PTL   pessary in place  continue prometrium.   S/p BMZ  Discussed that if continues to have pressure, to notify nurse - will need to evaluate for further dilation/PTL and tocolysis 2.  Previous C-section and breech  if labor ensues would need RCS    STINSON, JACOB JEHIEL, DO 07/15/2015,7:31 AM

## 2015-07-16 LAB — TYPE AND SCREEN
ABO/RH(D): O POS
ABO/RH(D): O POS
ANTIBODY SCREEN: NEGATIVE
Antibody Screen: NEGATIVE

## 2015-07-16 NOTE — Progress Notes (Signed)
Patient ID: Anne Mcdowell, female   DOB: 10/21/1985, 29 y.o.   MRN: [id number] FACULTY PRACTICE ANTEPARTUM(COMPREHENSIVE) NOTE  Anne Mcdowell is a 29 y.o. [phone] at 8774w2d by best estimate who is admitted for incompetent cervix/concern for preterm labor.   Cervical pessary is in place.  Fetal presentation is breech.  Length of Stay:  12  Days  Subjective: No complaints Patient reports the fetal movement as active. Patient reports uterine contraction  activity as none. Patient reports  vaginal bleeding as none. Patient describes fluid per vagina as None.  Vitals:  Blood pressure 125/73, pulse 102, temperature 97.9 F (36.6 C), temperature source Oral, resp. rate 18, height 5' (1.524 m), weight 161 lb 14.4 oz (73.437 kg), last menstrual period 02/04/2015, SpO2 100 %. Physical Examination:  General appearance - alert, well appearing, and in no distress Heart: regular rate, no murmur Lungs: clear to auscultation bilaterally, no wheezing.  Abdomen - gravid, NT Fundal Height:  size equals dates Cervical Exam: Not evaluated. Extremities: Homans sign is negative, no sign of DVT  Membranes:intact  Fetal Monitoring:  145 bpm, moderate variability, accelerations present, no concerning decelerations    Medications:  Scheduled . aspirin  81 mg Oral QHS  . citalopram  20 mg Oral QHS  . docusate sodium  100 mg Oral Daily  . hydroxychloroquine  400 mg Oral QHS  . prenatal multivitamin  1 tablet Oral Q1200  . progesterone  200 mg Vaginal QHS   I have reviewed the patient's current medications.  ASSESSMENT/PLAN: 1.  Short cervix with threatened PTL   pessary in place  continue prometrium.   S/p BMZ  Continue inpatient observation  2.  Previous C-section and breech  if labor ensues would need RCS  Reassuring fetal status    ANYANWU,UGONNA A, MD 07/16/2015,6:37 AM

## 2015-07-17 ENCOUNTER — Encounter: Payer: Medicaid Other | Admitting: Obstetrics & Gynecology

## 2015-07-17 ENCOUNTER — Encounter (HOSPITAL_COMMUNITY): Payer: Self-pay | Admitting: Anesthesiology

## 2015-07-17 ENCOUNTER — Encounter (HOSPITAL_COMMUNITY): Payer: Self-pay | Admitting: *Deleted

## 2015-07-17 DIAGNOSIS — O42919 Preterm premature rupture of membranes, unspecified as to length of time between rupture and onset of labor, unspecified trimester: Secondary | ICD-10-CM | POA: Diagnosis not present

## 2015-07-17 LAB — AMNISURE RUPTURE OF MEMBRANE (ROM) NOT AT ARMC: AMNISURE: POSITIVE

## 2015-07-17 MED ORDER — LACTATED RINGERS IV SOLN
INTRAVENOUS | Status: DC
  Administered 2015-07-17 – 2015-07-19 (×4): via INTRAVENOUS

## 2015-07-17 MED ORDER — MAGNESIUM SULFATE BOLUS VIA INFUSION
4.0000 g | Freq: Once | INTRAVENOUS | Status: AC
  Administered 2015-07-17: 4 g via INTRAVENOUS
  Filled 2015-07-17: qty 500

## 2015-07-17 MED ORDER — MAGNESIUM SULFATE 50 % IJ SOLN
2.0000 g/h | INTRAMUSCULAR | Status: AC
Start: 2015-07-17 — End: 2015-07-18
  Filled 2015-07-17: qty 80

## 2015-07-17 MED ORDER — CLINDAMYCIN PHOSPHATE 900 MG/50ML IV SOLN
900.0000 mg | Freq: Three times a day (TID) | INTRAVENOUS | Status: AC
  Administered 2015-07-17 – 2015-07-19 (×6): 900 mg via INTRAVENOUS
  Filled 2015-07-17 (×6): qty 50

## 2015-07-17 MED ORDER — CLINDAMYCIN HCL 300 MG PO CAPS
300.0000 mg | ORAL_CAPSULE | Freq: Three times a day (TID) | ORAL | Status: AC
  Administered 2015-07-19 – 2015-07-24 (×15): 300 mg via ORAL
  Filled 2015-07-17 (×15): qty 1

## 2015-07-17 MED ORDER — AZITHROMYCIN 250 MG PO TABS
1000.0000 mg | ORAL_TABLET | Freq: Once | ORAL | Status: AC
  Administered 2015-07-19: 1000 mg via ORAL
  Filled 2015-07-17 (×2): qty 4

## 2015-07-17 MED ORDER — GENTAMICIN SULFATE 40 MG/ML IJ SOLN
7.0000 mg/kg | INTRAVENOUS | Status: AC
  Administered 2015-07-17 – 2015-07-18 (×2): 320 mg via INTRAVENOUS
  Filled 2015-07-17 (×2): qty 8

## 2015-07-17 NOTE — Progress Notes (Signed)
MgSO4 4gm/2gm X 12 hours started for CP prophylaxis

## 2015-07-17 NOTE — Progress Notes (Signed)
Dr. Shawnie PonsPratt at bedside to assess patient in person. Pessary removed. Amnisure done.

## 2015-07-17 NOTE — Progress Notes (Signed)
Patient ID: Anne Mcdowell, female   DOB: 10/28/1985, 29 y.o.   MRN: [id number] Patient ID: Anne Ballslicia C Achterberg, female   DOB: 07/12/1986, 29 y.o.   MRN: [id number] FACULTY PRACTICE ANTEPARTUM(COMPREHENSIVE) NOTE  Anne Ballslicia C Burrill is a 29 y.o. [phone] at 3640w3d  by best estimate who is admitted for incompetent cervix/concern for preterm labor.   Cervical pessary is in place.  Fetal presentation is breech.  Length of Stay:  13  Days  Subjective: No complaints Patient reports the fetal movement as active. Patient reports uterine contraction  activity as none. Patient reports  vaginal bleeding as none. Patient describes fluid per vagina as None.  Vitals:  Blood pressure 121/74, pulse 89, temperature 98.3 F (36.8 C), temperature source Oral, resp. rate 16, height 5' (1.524 m), weight 161 lb 12.8 oz (73.392 kg), last menstrual period 02/04/2015, SpO2 100 %. Physical Examination:  General appearance - alert, well appearing, and in no distress Heart: regular rate, no murmur Lungs: clear to auscultation bilaterally, no wheezing.  Abdomen - gravid, NT Fundal Height:  size equals dates Cervical Exam: Not evaluated. Extremities: Homans sign is negative, no sign of DVT  Membranes:intact  Fetal Monitoring:  145 bpm, moderate variability, accelerations present, no concerning decelerations    Medications:  Scheduled . aspirin  81 mg Oral QHS  . citalopram  20 mg Oral QHS  . docusate sodium  100 mg Oral Daily  . hydroxychloroquine  400 mg Oral QHS  . prenatal multivitamin  1 tablet Oral Q1200  . progesterone  200 mg Vaginal QHS   I have reviewed the patient's current medications.  ASSESSMENT/PLAN:  4340w3d Estimated Date of Delivery: 10/20/15  1.  Short cervix with threatened PTL   pessary in place  continue prometrium.   S/p BMZ  Continue inpatient observation  2.  Previous C-section and breech  if labor ensues would need RCS  Reassuring fetal status  Original plan would be to  discharge tomorrow, if remains stable will consider and discuss with the team   Lazaro ArmsEURE,Aaryanna Hyden H, MD 07/17/2015,9:00 AM

## 2015-07-17 NOTE — Progress Notes (Signed)
ANTIBIOTIC CONSULT NOTE - INITIAL  Pharmacy Consult for Gentamicin Indication: PPROM with Penicillin allergy  Allergies  Allergen Reactions  . Penicillins Hives and Swelling    Has patient had a PCN reaction causing immediate rash, facial/tongue/throat swelling, SOB or lightheadedness with hypotension: Yes Has patient had a PCN reaction causing severe rash involving mucus membranes or skin necrosis: No Has patient had a PCN reaction that required hospitalization No Has patient had a PCN reaction occurring within the last 10 years: No If all of the above answers are "NO", then may proceed with Cephalosporin use.  . Vancomycin Hives and Swelling    Patient Measurements: Height: 5' (152.4 cm) Weight: 161 lb 12.8 oz (73.392 kg) IBW/kg (Calculated) : 45.5  Vital Signs: Temp: 98.3 F (36.8 C) (10/27 1101) Temp Source: Oral (10/27 1101) BP: 115/72 mmHg (10/27 1102) Pulse Rate: 89 (10/27 1102)   Assessment: [name] being initiated on clindamycin, gentamicin and azithromycin for new onset PPROM. Based on patient penicillin allergy, current literature recommends the following for PPROM:  Clindamycin 900 mg IV q8h x 48 hr followed by clindamycin 300 mg PO q8h x 5 days Gentamicin 7 mg/kg IV (dosed with IBW) q24h x 2 doses Azithromycin 1000 mg PO x 1  Plan:  Gentamicin 320 mg IV q24h x 2  Will continue to follow  Anne Mcdowell 07/17/2015,2:29 PM

## 2015-07-17 NOTE — Progress Notes (Signed)
Patient ID: Anne Mcdowell, female   DOB: 01/28/1986, 29 y.o.   MRN: [id number]   Subjective: Interval History: Patient reports gush of fluid. Denies bleeding, contractions.  Objective: Vital signs in last 24 hours: Temp:  [98.1 F (36.7 C)-98.3 F (36.8 C)] 98.3 F (36.8 C) (10/27 1101) Pulse Rate:  [80-94] 89 (10/27 1102) Resp:  [16-18] 16 (10/27 1101) BP: (111-121)/(60-74) 115/72 mmHg (10/27 1102) SpO2:  [100 %] 100 % (10/27 0000)   BP 115/72 mmHg  Pulse 89  Temp(Src) 98.3 F (36.8 C) (Oral)  Resp 16  Ht 5' (1.524 m)  Wt 161 lb 12.8 oz (73.392 kg)  BMI 31.60 kg/m2  SpO2 100%  LMP 02/04/2015  General Appearance:    Alert, cooperative, no distress, appears stated age  Head:    Normocephalic, without obvious abnormality, atraumatic  Neck:   Supple, symmetrical, trachea midline,  Lungs:     respirations unlabored   Heart:    Regular rate and rhythm  Abdomen:    Gravid, NT  Genitalia:    Normal female without lesion, + pool, + fern, visually cervix is 1 cm dilated  Extremities:   Non-tender, no evidence of DVT  Neurologic:   Non-focal    Scheduled Meds: . aspirin  81 mg Oral QHS  . citalopram  20 mg Oral QHS  . docusate sodium  100 mg Oral Daily  . hydroxychloroquine  400 mg Oral QHS  . prenatal multivitamin  1 tablet Oral Q1200  . progesterone  200 mg Vaginal QHS    Assessment/Plan: Principal Problem:   Preterm premature rupture of membranes (PPROM) with unknown onset of labor Active Problems:   Previous cesarean section   Short cervix, antepartum  New ROM--will start IV abx for latency. Delivery with s/sx's of infection.   LOS: 13 days   Korby Ratay S, MD 07/17/2015 1:57 PM

## 2015-07-18 DIAGNOSIS — Z3A26 26 weeks gestation of pregnancy: Secondary | ICD-10-CM

## 2015-07-18 NOTE — Progress Notes (Signed)
Patient ID: Anne Mcdowell, female   DOB: 06/07/1986, 29 y.o.   MRN: [id number] FACULTY PRACTICE ANTEPARTUM(COMPREHENSIVE) NOTE  Anne Ballslicia C Mcclurkin is a 29 y.o. [phone] at 2519w4d by best clinical estimate who is admitted for PROM.   Fetal presentation is unsure. Length of Stay:  14  Days  Subjective: Has headache on magnesium Patient reports the fetal movement as active. Patient reports uterine contraction  activity as none. Patient reports  vaginal bleeding as none. Patient describes fluid per vagina as Clear.  Vitals:  Blood pressure 106/60, pulse 93, temperature 97.8 F (36.6 C), temperature source Oral, resp. rate 14, height 5' (1.524 m), weight 161 lb 12.8 oz (73.392 kg), last menstrual period 02/04/2015, SpO2 100 %. Physical Examination:  General appearance - alert, well appearing, and in no distress Mental status - alert, oriented to person, place, and time Chest - Normal effort Abdomen - gravid, NT Fundal Height:  size equals dates Extremities: extremities normal, atraumatic, no cyanosis or edema  Membranes:ruptured, clear fluid  Fetal Monitoring:  Baseline: 150 bpm, Variability: Good {> 6 bpm), Accelerations: Non-reactive but appropriate for gestational age and Decelerations: Absent  Labs:  Results for orders placed or performed during the hospital encounter of 07/04/15 (from the past 24 hour(s))  [facility]   Collection Time: 07/17/15  1:40 PM  Result Value Ref Range   Amnisure ROM POSITIVE     Medications:  Scheduled . aspirin  81 mg Oral QHS  . [START ON 07/19/2015] azithromycin  1,000 mg Oral Once  . citalopram  20 mg Oral QHS  . [START ON 07/19/2015] clindamycin  300 mg Oral Q8H  . clindamycin (CLEOCIN) IV  900 mg Intravenous Q8H  . docusate sodium  100 mg Oral Daily  . gentamicin  7 mg/kg (Ideal) Intravenous Q24H  . hydroxychloroquine  400 mg Oral QHS  . prenatal multivitamin  1 tablet Oral Q1200  . progesterone  200 mg Vaginal  QHS   I have reviewed the patient's current medications.  ASSESSMENT: Principal Problem:   Preterm premature rupture of membranes (PPROM) with unknown onset of labor Active Problems:   Previous cesarean section   Short cervix, antepartum New problem-headache-related to magnesium  PLAN: Assume headache will stop when magnesium is turned off--on x 12 hours for CP prophylaxis only Continue IV abx x 1 more day, then switch to po. Now with ROM, she will be here until delivery. Delivery will occur with s/sx's of chorio. Nothing per vagina  Reva BoresPRATT,Kristina Bertone S, MD 07/18/2015,7:27 AM

## 2015-07-19 MED ORDER — ONDANSETRON 4 MG PO TBDP
8.0000 mg | ORAL_TABLET | Freq: Three times a day (TID) | ORAL | Status: DC | PRN
  Administered 2015-07-19 – 2015-08-02 (×5): 8 mg via ORAL
  Filled 2015-07-19 (×6): qty 2

## 2015-07-19 MED ORDER — PROMETHAZINE HCL 25 MG/ML IJ SOLN
25.0000 mg | Freq: Once | INTRAMUSCULAR | Status: DC
  Filled 2015-07-19: qty 1

## 2015-07-19 NOTE — Progress Notes (Signed)
Patient ID: Anne Mcdowell, female   DOB: 04/26/1986, 29 y.o.   MRN: [id number] FACULTY PRACTICE ANTEPARTUM(COMPREHENSIVE) NOTE  Anne Ballslicia C Mcclimans is a 29 y.o. [phone] at 6175w4d by best clinical estimate who is admitted for PROM.   Fetal presentation is unsure. Length of Stay:  15  Days  Subjective: Patient is without complaints this morning.  Patient reports the fetal movement as active. Patient reports uterine contraction  activity as none. Patient reports  vaginal bleeding as none. Patient describes fluid per vagina as Clear.  Vitals:  Blood pressure 106/66, pulse 91, temperature 98.2 F (36.8 C), temperature source Oral, resp. rate 18, height 5' (1.524 m), weight 161 lb 12.8 oz (73.392 kg), last menstrual period 02/04/2015, SpO2 100 %. Physical Examination:  General appearance - alert, well appearing, and in no distress Mental status - alert, oriented to person, place, and time Chest - Normal effort Abdomen - gravid, NT Fundal Height:  size equals dates Extremities: extremities normal, atraumatic, no cyanosis or edema  Membranes:ruptured, clear fluid  Fetal Monitoring:  Baseline: 140 bpm, Variability: Good {> 6 bpm), Accelerations: Non-reactive but appropriate for gestational age, Decelerations: Absent and Toco: no contractions  Labs:  No results found for this or any previous visit (from the past 24 hour(s)).  Medications:  Scheduled . aspirin  81 mg Oral QHS  . citalopram  20 mg Oral QHS  . clindamycin  300 mg Oral Q8H  . clindamycin (CLEOCIN) IV  900 mg Intravenous Q8H  . docusate sodium  100 mg Oral Daily  . hydroxychloroquine  400 mg Oral QHS  . prenatal multivitamin  1 tablet Oral Q1200   I have reviewed the patient's current medications.  ASSESSMENT: Principal Problem:   Preterm premature rupture of membranes (PPROM) with unknown onset of labor Active Problems:   Previous cesarean section   Short cervix, antepartum   PLAN: Continue latency antibiotics Continue  monitoring for signs/symptoms of chorioamnionitis. Nothing per vagina NICU consult to discuss expectations with preterm prolonged rupture of membranes  Yarelli Decelles, MD 07/19/2015,6:20 AM

## 2015-07-20 DIAGNOSIS — O26872 Cervical shortening, second trimester: Secondary | ICD-10-CM

## 2015-07-20 DIAGNOSIS — O42912 Preterm premature rupture of membranes, unspecified as to length of time between rupture and onset of labor, second trimester: Secondary | ICD-10-CM

## 2015-07-20 LAB — TYPE AND SCREEN
ABO/RH(D): O POS
ANTIBODY SCREEN: NEGATIVE

## 2015-07-20 NOTE — Progress Notes (Signed)
Patient ID: Anne Mcdowell, female   DOB: 03/05/1986, 29 y.o.   MRN: [id number] Patient ID: Anne Mcdowell, female   DOB: 07/28/1986, 29 y.o.   MRN: [id number] FACULTY PRACTICE ANTEPARTUM(COMPREHENSIVE) NOTE  Anne Mcdowell is a 29 y.o. [phone] at 4158w6d  by best clinical estimate who is admitted for PROM.   Fetal presentation is unsure. Length of Stay:  16  Days  Subjective: Patient is without complaints this morning.  Patient reports the fetal movement as active. Patient reports uterine contraction  activity as none. Patient reports  vaginal bleeding as none. Patient describes fluid per vagina as Clear.  Vitals:  Blood pressure 106/60, pulse 89, temperature 98.2 F (36.8 C), temperature source Oral, resp. rate 16, height 5' (1.524 m), weight 161 lb 12.8 oz (73.392 kg), last menstrual period 02/04/2015, SpO2 100 %. Physical Examination:  General appearance - alert, well appearing, and in no distress Mental status - alert, oriented to person, place, and time Chest - Normal effort Abdomen - gravid, NT Fundal Height:  size equals dates Extremities: extremities normal, atraumatic, no cyanosis or edema  Membranes:ruptured, clear fluid  Fetal Monitoring:  Baseline: 140 bpm, Variability: Good {> 6 bpm), Accelerations: Non-reactive but appropriate for gestational age, Decelerations: Absent and Toco: no contractions  Labs:  No results found for this or any previous visit (from the past 24 hour(s)).  Medications:  Scheduled . aspirin  81 mg Oral QHS  . citalopram  20 mg Oral QHS  . clindamycin  300 mg Oral Q8H  . docusate sodium  100 mg Oral Daily  . hydroxychloroquine  400 mg Oral QHS  . prenatal multivitamin  1 tablet Oral Q1200   I have reviewed the patient's current medications.  ASSESSMENT: Principal Problem:   Preterm premature rupture of membranes (PPROM) with unknown onset of labor Active Problems:   Previous cesarean section   Short cervix, antepartum   PLAN: Continue  latency antibiotics, S/P betamehtasone course 10/9,10/10 Continue monitoring for signs/symptoms of chorioamnionitis. Nothing per vagina NICU consult to discuss expectations with preterm prolonged rupture of membranes  Lazaro ArmsEURE,Neka Bise H, MD 07/20/2015,7:54 PM

## 2015-07-20 NOTE — Consult Note (Signed)
Neonatology Consult:  Asked by Dr Despina HiddenEure to provide follow-up consult to address expectations with prolonged premature rupture of membranes.   Chart reviewed. Ms Anne Mcdowell has been counseled previously by Dr Leary RocaEhrmann and Dr Joana ReameraVanzo at earlier gestation. She is now 26 6/[redacted] wks gestation, S/P betamethasone on 10/6 and 10/7 and magnesium for neuro prophylaxis. SROM on 10/27. Currently on latency antibiotics, afebrile. Last US was on 10/14. Based on US on 10/7, fetal growth was at 47%.  I spoke to Ms Anne Mcdowell in her room. Her fiance, her mom and grandmother were present at her request. I discussed the positive developments since the last Neo consult, eg, 2 wks older in gestation and resultant expectation of a bigger and more mature baby, and completion of betamethasone and magnesium sulfate to the benefit of the baby. I discussed the most common complications of prolonged PROM such as infection in the setting of immune compromised fetus and complications with lung development.  I suggest a follow up fetal US as it will be helpful on our end to know if Ms Anne Mcdowell has low or normal amniotic fluid and to know if the baby is growing appropriately.  Thank you for this consult.  Lucillie Garfinkelita Q Tisheena Maguire MD Neonatologist [phone]  The total length of face-to-face or floor/unit time for this encounter was 25 minutes. Counseling and/or coordination of care was 15 minutes of the above

## 2015-07-21 DIAGNOSIS — Z3A27 27 weeks gestation of pregnancy: Secondary | ICD-10-CM

## 2015-07-21 LAB — GLUCOSE TOLERANCE, 1 HOUR: GLUCOSE 1 HOUR GTT: 146 mg/dL — AB (ref 70–140)

## 2015-07-21 NOTE — Progress Notes (Signed)
Patient ID: Anne Mcdowell, female   DOB: 06/28/1986, 29 y.o.   MRN: [id number] FACULTY PRACTICE ANTEPARTUM(COMPREHENSIVE) NOTE  Anne Mcdowell is a 29 y.o. [phone] at 7649w0d by early ultrasound who is admitted for PPROM.   Fetal presentation is breech. Length of Stay:  17  Days  Subjective: Feels well. No complaints today. Patient reports the fetal movement as active. Patient reports uterine contraction  activity as none. Patient reports  vaginal bleeding as none. Patient describes fluid per vagina as Clear.  Vitals:  Blood pressure 115/67, pulse 93, temperature 98.6 F (37 C), temperature source Oral, resp. rate 18, height 5' (1.524 m), weight 161 lb 12.8 oz (73.392 kg), last menstrual period 02/04/2015, SpO2 100 %. Physical Examination:  General appearance - alert, well appearing, and in no distress Chest - normal effort Abdomen - gravid, NT Neurological - alert, oriented, normal speech, no focal findings or movement disorder noted Fundal Height:  size equals dates Extremities: Homans sign is negative, no sign of DVT  Membranes:ruptured, clear fluid  Fetal Monitoring:  Baseline: 145 bpm, Variability: Good {> 6 bpm), Accelerations: Reactive and Decelerations: Absent   Medications:  Scheduled . aspirin  81 mg Oral QHS  . citalopram  20 mg Oral QHS  . clindamycin  300 mg Oral Q8H  . docusate sodium  100 mg Oral Daily  . hydroxychloroquine  400 mg Oral QHS  . prenatal multivitamin  1 tablet Oral Q1200   I have reviewed the patient's current medications.  ASSESSMENT: Principal Problem:   Preterm premature rupture of membranes (PPROM) with unknown onset of labor Active Problems:   Previous cesarean section   Short cervix, antepartum   PLAN: 1 hour glucola today U/S for growth tomorrow Inpatient monitoring Delivery with s/sx's chorio Latent antibiotics continue.  Reva BoresPRATT,TANYA S, MD 07/21/2015,7:19 AM

## 2015-07-21 NOTE — Plan of Care (Signed)
Problem: Phase I Progression Outcomes Goal: Diabetic Assessment Outcome: Completed/Met Date Met:  07/21/15 1 hour glucose testing completed

## 2015-07-22 ENCOUNTER — Inpatient Hospital Stay (HOSPITAL_COMMUNITY): Payer: Medicaid Other

## 2015-07-22 DIAGNOSIS — O99810 Abnormal glucose complicating pregnancy: Secondary | ICD-10-CM | POA: Diagnosis not present

## 2015-07-22 DIAGNOSIS — R739 Hyperglycemia, unspecified: Secondary | ICD-10-CM

## 2015-07-22 LAB — GLUCOSE, 2 HOUR GESTATIONAL: Glucose Tolerance, 2 hour: 163 mg/dL (ref 70–164)

## 2015-07-22 LAB — GLUCOSE, FASTING GESTATIONAL: GLUCOSE, FASTING-GESTATIONAL: 82 mg/dL

## 2015-07-22 LAB — GLUCOSE, 3 HOUR GESTATIONAL: Glucose, GTT - 3 Hour: 77 mg/dL (ref 70–144)

## 2015-07-22 LAB — GLUCOSE, 1 HOUR GESTATIONAL: GLUCOSE, 1 HOUR-GESTATIONAL: 176 mg/dL (ref 70–189)

## 2015-07-22 NOTE — Progress Notes (Signed)
Patient ID: Almedia Ballslicia C Mcgillicuddy, female   DOB: 02/03/1986, 29 y.o.   MRN: [id number] FACULTY PRACTICE ANTEPARTUM(COMPREHENSIVE) NOTE  Almedia Ballslicia C Marsalis is a 29 y.o. [phone] at 5727w0d by early ultrasound who is admitted for PPROM.   Fetal presentation is breech. Length of Stay:  18  Days  Subjective: Feels well. No complaints today. Patient reports the fetal movement as active. Patient reports uterine contraction  activity as none. Patient reports  vaginal bleeding as none. Patient describes fluid per vagina as Clear.  Vitals:  Blood pressure 119/66, pulse 101, temperature 98.9 F (37.2 C), temperature source Oral, resp. rate 18, height 5' (1.524 m), weight 161 lb 12.8 oz (73.392 kg), last menstrual period 02/04/2015, SpO2 100 %. Physical Examination:  General appearance - alert, well appearing, and in no distress Heart: regular rate, no murmur Lungs: clear to auscultation bilaterally, no wheezing. Abdomen - gravid, nontender Neurological - alert, oriented, normal speech, no focal findings or movement disorder noted Fundal Height:  size equals dates Extremities: Homans sign is negative, no sign of DVT  Membranes:ruptured, clear fluid  Fetal Monitoring:  Baseline: 145 bpm, Variability: Good {> 6 bpm), Accelerations: Reactive and Decelerations: Absent   Medications:  Scheduled . aspirin  81 mg Oral QHS  . citalopram  20 mg Oral QHS  . clindamycin  300 mg Oral Q8H  . docusate sodium  100 mg Oral Daily  . hydroxychloroquine  400 mg Oral QHS  . prenatal multivitamin  1 tablet Oral Q1200   I have reviewed the patient's current medications.  ASSESSMENT: Principal Problem:   Preterm premature rupture of membranes (PPROM) with unknown onset of labor Active Problems:   Previous cesarean section   Short cervix, antepartum   PLAN: 1.  PPROM  Continue latency antibiotics  US for growth, position  Delivery for signs/sxs of intrauterine infection 2.  Elevated 1hr Glucola  3hr glucola  started today   STINSON, JACOB JEHIEL, DO 07/22/2015,6:37 AM

## 2015-07-23 ENCOUNTER — Encounter (HOSPITAL_COMMUNITY): Payer: Self-pay | Admitting: *Deleted

## 2015-07-23 LAB — TYPE AND SCREEN
ABO/RH(D): O POS
Antibody Screen: NEGATIVE

## 2015-07-23 NOTE — Progress Notes (Signed)
Patient ID: Anne Mcdowell, female   DOB: 06/07/1986, 29 y.o.   MRN: [id number] FACULTY PRACTICE ANTEPARTUM(COMPREHENSIVE) NOTE  Anne Mcdowell is a 29 y.o. [phone] at 7065w2d  who is admitted for PPROM.   Length of Stay:  19  Days  Subjective: Patient reports the fetal movement as active. Patient reports uterine contraction  activity as none. Patient reports  vaginal bleeding as none. Patient describes fluid per vagina as Clear.  Vitals:  Blood pressure 111/67, pulse 91, temperature 98.6 F (37 C), temperature source Oral, resp. rate 18, height 5' (1.524 m), weight 161 lb 12.8 oz (73.392 kg), last menstrual period 02/04/2015, SpO2 100 %. Physical Examination:  General appearance - alert, well appearing, and in no distress Abdomen - soft, nontender, nondistended, no masses or organomegaly Extremities - Homan's sign negative bilaterally  Fetal Monitoring:  Baseline: 150 bpm, Variability: Good {> 6 bpm), Accelerations: Reactive and Decelerations: Absent  Labs:  Results for orders placed or performed during the hospital encounter of 07/04/15 (from the past 24 hour(s))  Glucose, 1 hour gestational   Collection Time: 07/22/15  7:30 AM  Result Value Ref Range   Glucose, 1 Hour-Gestational 176 70 - 189 mg/dL  Glucose, 2 hour gestational   Collection Time: 07/22/15  8:36 AM  Result Value Ref Range   Glucose, 2 Hour-Gestational 163 70 - 164 mg/dL  Glucose, 3 hour gestational   Collection Time: 07/22/15  9:30 AM  Result Value Ref Range   Glucose, GTT - 3 Hour 77 70 - 144 mg/dL    Medications:  Scheduled . aspirin  81 mg Oral QHS  . citalopram  20 mg Oral QHS  . clindamycin  300 mg Oral Q8H  . docusate sodium  100 mg Oral Daily  . hydroxychloroquine  400 mg Oral QHS  . prenatal multivitamin  1 tablet Oral Q1200   I have reviewed the patient's current medications.  ASSESSMENT: Patient Active Problem List   Diagnosis Date Noted  . Abnormal O'Sullivan glucose challenge test,  antepartum 07/22/2015  . Preterm premature rupture of membranes (PPROM) with unknown onset of labor 07/17/2015  . Short cervix, antepartum 07/04/2015  . Short cervical length during pregnancy 06/26/2015  . Obesity affecting pregnancy, antepartum 05/01/2015  . Previous cesarean section 05/01/2015  . Sickle cell trait (HCC) 04/07/2015  . Supervision of high risk pregnancy, antepartum 04/03/2015  . 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  Anne Mcdowell H. 07/23/2015,6:27 AM

## 2015-07-24 ENCOUNTER — Ambulatory Visit: Payer: Medicaid Other | Admitting: Cardiology

## 2015-07-24 DIAGNOSIS — O26872 Cervical shortening, second trimester: Secondary | ICD-10-CM | POA: Diagnosis present

## 2015-07-24 NOTE — Progress Notes (Signed)
Patient ID: Anne Mcdowell, female   DOB: 01/28/1986, 29 y.o.   MRN: [id number] FACULTY PRACTICE ANTEPARTUM(COMPREHENSIVE) NOTE  Anne Ballslicia C Sledd is a 29 y.o. [phone] at 3173w3d  who is admitted for PPROM.   Length of Stay:  20  Days  Subjective: Patient reports the fetal movement as active. Patient reports uterine contraction  activity as none. Patient reports  vaginal bleeding as none. Patient describes fluid per vagina as Clear.  Vitals:  Blood pressure 107/66, pulse 91, temperature 98.9 F (37.2 C), temperature source Oral, resp. rate 18, height 5' (1.524 m), weight 158 lb 1.6 oz (71.714 kg), last menstrual period 02/04/2015, SpO2 100 %. Physical Examination:  General appearance - alert, well appearing, and in no distress Abdomen - soft, nontender, gravid Extremities - Homan's sign negative bilaterally  Fetal Monitoring:  Baseline: 150 bpm, Variability: Good {> 6 bpm), Accelerations: Non-reactive but appropriate for gestational age, Decelerations: Absent and Toco: no contractions  Labs:  Results for orders placed or performed during the hospital encounter of 07/04/15 (from the past 24 hour(s))  [facility]   Collection Time: 07/23/15 12:25 PM  Result Value Ref Range   ABO/RH(D) O POS    Antibody Screen NEG    Sample Expiration 07/26/2015     Medications:  Scheduled . aspirin  81 mg Oral QHS  . citalopram  20 mg Oral QHS  . docusate sodium  100 mg Oral Daily  . hydroxychloroquine  400 mg Oral QHS  . prenatal multivitamin  1 tablet Oral Q1200   I have reviewed the patient's current medications.  ASSESSMENT: Patient Active Problem List   Diagnosis Date Noted  . Abnormal O'Sullivan glucose challenge test, antepartum 07/22/2015  . Preterm premature rupture of membranes (PPROM) with unknown onset of labor 07/17/2015  . Short cervix, antepartum 07/04/2015  . Short cervical length during pregnancy 06/26/2015  . Obesity affecting pregnancy,  antepartum 05/01/2015  . Previous cesarean section 05/01/2015  . Sickle cell trait (HCC) 04/07/2015  . Supervision of high risk pregnancy, antepartum 04/03/2015  . 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

## 2015-07-24 NOTE — Plan of Care (Signed)
Problem: Consults Goal: Birthing Suites Patient Information Press F2 to bring up selections list  Outcome: Completed/Met Date Met:  07/24/15  Pt < [redacted] weeks EGA

## 2015-07-25 DIAGNOSIS — M329 Systemic lupus erythematosus, unspecified: Secondary | ICD-10-CM

## 2015-07-25 NOTE — Progress Notes (Signed)
Patient ID: Anne Mcdowell, female   DOB: 05/12/1986, 29 y.o.   MRN: [id number] FACULTY PRACTICE ANTEPARTUM(COMPREHENSIVE) NOTE  Anne Mcdowell is a 29 y.o. [phone] at 4090w4d  who is admitted for PPROM.   Length of Stay:  21  Days  Subjective: Patient reports the fetal movement as active. Patient reports uterine contraction  activity as none. Patient reports  vaginal bleeding as none. Patient describes fluid per vagina as Clear.  Vitals:  Blood pressure 109/68, pulse 91, temperature 98.8 F (37.1 C), temperature source Oral, resp. rate 18, height 5' (1.524 m), weight 158 lb 1.6 oz (71.714 kg), last menstrual period 02/04/2015, SpO2 100 %. Physical Examination:  General appearance - alert, well appearing, and in no distress Abdomen - soft, nontender, gravid Extremities - Homan's sign negative bilaterally  Fetal Monitoring:  Baseline: 145 bpm, Variability: Good {> 6 bpm), Accelerations: Non-reactive but appropriate for gestational age, Decelerations: Absent and Toco: no contractions  Labs:  No results found for this or any previous visit (from the past 24 hour(s)).  Medications:  Scheduled . aspirin  81 mg Oral QHS  . citalopram  20 mg Oral QHS  . docusate sodium  100 mg Oral Daily  . hydroxychloroquine  400 mg Oral QHS  . prenatal multivitamin  1 tablet Oral Q1200   I have reviewed the patient's current medications.  ASSESSMENT: Patient Active Problem List   Diagnosis Date Noted  . Cervical shortening in second trimester   . Lupus (HCC)   . Abnormal O'Sullivan glucose challenge test, antepartum 07/22/2015  . Preterm premature rupture of membranes (PPROM) with unknown onset of labor 07/17/2015  . Short cervix, antepartum 07/04/2015  . Short cervical length during pregnancy 06/26/2015  . Obesity affecting pregnancy, antepartum 05/01/2015  . Previous cesarean section 05/01/2015  . Sickle cell trait (HCC) 04/07/2015  . Supervision of high risk pregnancy, antepartum 04/03/2015   . 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

## 2015-07-26 LAB — TYPE AND SCREEN
ABO/RH(D): O POS
Antibody Screen: NEGATIVE

## 2015-07-26 NOTE — Progress Notes (Signed)
FACULTY PRACTICE ANTEPARTUM(COMPREHENSIVE) NOTE  Anne Mcdowell is a 29 y.o. [phone] at 5523w5d by early ultrasound who is admitted for PROM, incompetent cervix.   Fetal presentation is cephalic. Length of Stay:  22  Days  Subjective:  Patient reports the fetal movement as active. Patient reports uterine contraction  activity as occasional . Patient reports  vaginal bleeding as none. Patient describes fluid per vagina as Clear.  Vitals:  Blood pressure 102/58, pulse 90, temperature 97.8 F (36.6 C), temperature source Oral, resp. rate 18, height 5' (1.524 m), weight 71.714 kg (158 lb 1.6 oz), last menstrual period 02/04/2015, SpO2 100 %. Physical Examination:  General appearance - alert, well appearing, and in no distress Heart - normal rate and regular rhythm Abdomen - soft, nontender, nondistended Fundal Height:  size equals dates Cervical Exam: Not evaluated. Extremities: extremities normal, atraumatic, no cyanosis or edema and Homans sign is negative, no sign of DVT with DTRs 2+ bilaterally Membranes:ruptured  Fetal Monitoring:   Fetal Heart Rate A      Mode  External filed at 07/25/2015 2048    Baseline Rate (A)  145 bpm filed at 07/25/2015 2122    Variability  6-25 BPM filed at 07/25/2015 2122    Accelerations  10 x 10 filed at 07/25/2015 2122    Decelerations  Variable filed at 07/25/2015 2122        Labs:  No results found for this or any previous visit (from the past 24 hour(s)).  Imaging Studies:    Currently EPIC will not allow sonographic studies to automatically populate into notes.  In the meantime, copy and paste results into note or free text.  Medications:  Scheduled . aspirin  81 mg Oral QHS  . citalopram  20 mg Oral QHS  . docusate sodium  100 mg Oral Daily  . hydroxychloroquine  400 mg Oral QHS  . prenatal multivitamin  1 tablet Oral Q1200   I have reviewed the patient's current medications.  ASSESSMENT: Patient Active Problem List   Diagnosis Date Noted  . Cervical shortening in second trimester   . Lupus (HCC)   . Abnormal O'Sullivan glucose challenge test, antepartum 07/22/2015  . Preterm premature rupture of membranes (PPROM) with unknown onset of labor 07/17/2015  . Short cervix, antepartum 07/04/2015  . Short cervical length during pregnancy 06/26/2015  . Obesity affecting pregnancy, antepartum 05/01/2015  . Previous cesarean section 05/01/2015  . Sickle cell trait (HCC) 04/07/2015  . Supervision of high risk pregnancy, antepartum 04/03/2015  . 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

## 2015-07-27 DIAGNOSIS — O99112 Other diseases of the blood and blood-forming organs and certain disorders involving the immune mechanism complicating pregnancy, second trimester: Secondary | ICD-10-CM

## 2015-07-27 DIAGNOSIS — O42919 Preterm premature rupture of membranes, unspecified as to length of time between rupture and onset of labor, unspecified trimester: Secondary | ICD-10-CM | POA: Diagnosis not present

## 2015-07-27 NOTE — Progress Notes (Signed)
Daylight Savings Time

## 2015-07-27 NOTE — Progress Notes (Signed)
FACULTY PRACTICE ANTEPARTUM(COMPREHENSIVE) NOTE  Anne Mcdowell is a 29 y.o. [phone] at 5558w6d by early ultrasound who is admitted for PPROM, incompetent cervix.   Fetal presentation is cephalic. Length of Stay:  23  Days  Subjective: No complaints Patient reports the fetal movement as active. Patient reports uterine contraction  activity as occasional . Patient reports  vaginal bleeding as none. Patient describes fluid per vagina as Clear.  Vitals:  Blood pressure 110/56, pulse 91, temperature 98.5 F (36.9 C), temperature source Oral, resp. rate 18, height 5' (1.524 m), weight 158 lb 1.6 oz (71.714 kg), last menstrual period 02/04/2015, SpO2 100 %. Physical Examination:  General appearance - alert, well appearing, and in no distress Heart - normal rate and regular rhythm Abdomen - soft, nontender, nondistended Fundal Height:  size equals dates Cervical Exam: Not evaluated. Extremities: extremities normal, atraumatic, no cyanosis or edema and Homans sign is negative, no sign of DVT with DTRs 2+ bilaterally Membranes:ruptured  Fetal Monitoring:  Baseline 145, moderate variability, +accels, no decels  Labs:  Results for orders placed or performed during the hospital encounter of 07/04/15 (from the past 24 hour(s))  Type and screen [facility] [place]   Collection Time: 07/26/15  1:07 PM  Result Value Ref Range   ABO/RH(D) O POS    Antibody Screen NEG    Sample Expiration 07/29/2015     Medications:  Scheduled . aspirin  81 mg Oral QHS  . citalopram  20 mg Oral QHS  . docusate sodium  100 mg Oral Daily  . hydroxychloroquine  400 mg Oral QHS  . prenatal multivitamin  1 tablet Oral Q1200   I have reviewed the patient's current medications.  ASSESSMENT: Principal Problem:   Preterm premature rupture of membranes (PPROM) with unknown onset of labor Active Problems:   Previous cesarean section   Short cervix, antepartum   Abnormal O'Sullivan glucose challenge  test, antepartum   Cervical shortening in second trimester   Lupus (HCC)   PLAN: Continue to observe for signs of PTL or infection SLE is stable on Hydroxychloroquine Continue routine antenatal inpatient care  Ell[facility] Health CenterNYANWU,Colyn Miron A, MD 07/27/2015,10:08 AM

## 2015-07-28 DIAGNOSIS — O42919 Preterm premature rupture of membranes, unspecified as to length of time between rupture and onset of labor, unspecified trimester: Secondary | ICD-10-CM | POA: Diagnosis not present

## 2015-07-28 NOTE — Progress Notes (Signed)
FACULTY PRACTICE ANTEPARTUM(COMPREHENSIVE) NOTE  Anne Mcdowell is a 29 y.o. [phone] at 3915w0d by early ultrasound who is admitted for PPROM, incompetent cervix.   Fetal presentation is cephalic. Length of Stay:  24  Days  Subjective: No complaints Patient reports the fetal movement as active. Patient reports uterine contraction  activity as occasional . Patient reports  vaginal bleeding as none. Patient describes fluid per vagina as Clear.  Vitals:  Blood pressure 110/72, pulse 99, temperature 98.6 F (37 C), temperature source Oral, resp. rate 18, height 5' (1.524 m), weight 158 lb 1.6 oz (71.714 kg), last menstrual period 02/04/2015, SpO2 100 %. Physical Examination:  General appearance - alert, well appearing, and in no distress Heart - normal rate and regular rhythm Abdomen - soft, nontender, nondistended Fundal Height:  size equals dates Cervical Exam: Not evaluated. Extremities: extremities normal, atraumatic, no cyanosis or edema and Homans sign is negative, no sign of DVT with DTRs 2+ bilaterally Membranes:ruptured  Fetal Monitoring:  Baseline 145, moderate variability, +accels, no decels  Labs:  No results found for this or any previous visit (from the past 24 hour(s)).  Medications:  Scheduled . aspirin  81 mg Oral QHS  . citalopram  20 mg Oral QHS  . docusate sodium  100 mg Oral Daily  . hydroxychloroquine  400 mg Oral QHS  . prenatal multivitamin  1 tablet Oral Q1200   I have reviewed the patient's current medications.  ASSESSMENT: Principal Problem:   Preterm premature rupture of membranes (PPROM) with unknown onset of labor Active Problems:   Previous cesarean section   Short cervix, antepartum   Abnormal O'Sullivan glucose challenge test, antepartum   Cervical shortening in second trimester   Lupus (HCC)   PLAN: Continue to observe for signs of PTL or infection SLE is stable on Hydroxychloroquine Continue routine antenatal inpatient  care  [facility] LLCNYANWU,Martrell Eguia A, MD 07/28/2015,7:53 AM

## 2015-07-29 DIAGNOSIS — O42919 Preterm premature rupture of membranes, unspecified as to length of time between rupture and onset of labor, unspecified trimester: Secondary | ICD-10-CM | POA: Diagnosis not present

## 2015-07-29 LAB — TYPE AND SCREEN
ABO/RH(D): O POS
ANTIBODY SCREEN: NEGATIVE

## 2015-07-29 NOTE — Progress Notes (Signed)
FACULTY PRACTICE ANTEPARTUM(COMPREHENSIVE) NOTE  Anne Mcdowell is a 29 y.o. [phone] at 8919w0d by early ultrasound who is admitted for PPROM, incompetent cervix.   Fetal presentation is cephalic. Length of Stay:  25  Days  Subjective: No complaints Patient reports the fetal movement as active. Patient reports uterine contraction  activity as occasional . Patient reports  vaginal bleeding as none. Patient describes fluid per vagina as Clear.  Vitals:  Blood pressure 119/79, pulse 96, temperature 98.5 F (36.9 C), temperature source Oral, resp. rate 18, height 5' (1.524 m), weight 158 lb 1.6 oz (71.714 kg), last menstrual period 02/04/2015, SpO2 100 %. Physical Examination:  General appearance - alert, well appearing, and in no distress Heart - normal rate and regular rhythm Abdomen - soft, nontender, nondistended Fundal Height:  size equals dates Cervical Exam: Not evaluated. Extremities: extremities normal, atraumatic, no cyanosis or edema and Homans sign is negative, no sign of DVT with DTRs 2+ bilaterally Membranes:ruptured  Fetal Monitoring:  Baseline 145, moderate variability, +accels, no decels  Labs:  No results found for this or any previous visit (from the past 24 hour(s)).  Medications:  Scheduled . aspirin  81 mg Oral QHS  . citalopram  20 mg Oral QHS  . docusate sodium  100 mg Oral Daily  . hydroxychloroquine  400 mg Oral QHS  . prenatal multivitamin  1 tablet Oral Q1200   I have reviewed the patient's current medications.  ASSESSMENT: Principal Problem:   Preterm premature rupture of membranes (PPROM) with unknown onset of labor Active Problems:   Previous cesarean section   Short cervix, antepartum   Abnormal O'Sullivan glucose challenge test, antepartum   Cervical shortening in second trimester   Lupus (HCC)   PLAN: 1.  PPROM Stable. S/p latency antibiotics No si/sx intrauterine infection Continue NST NST reactive  Levie HeritageJacob J Stinson,  DO 07/29/2015,6:27 AM

## 2015-07-29 NOTE — Plan of Care (Signed)
Problem: Phase I Progression Outcomes Goal: Spiritual Needs (Notify SW/CM or Social Work) Outcome: Completed/Met Date Met:  07/29/15 [place] to visit

## 2015-07-30 NOTE — Progress Notes (Signed)
Patient ID: Anne Mcdowell, female   DOB: 01/26/1986, 29 y.o.   MRN: [id number] FACULTY PRACTICE ANTEPARTUM(COMPREHENSIVE) NOTE  Anne Mcdowell is a 29 y.o. [phone] at 2282w2d by early ultrasound who is admitted for PPROM, incompetent cervix.   Fetal presentation is cephalic. Length of Stay:  26  Days  Subjective: No complaints Patient reports the fetal movement as active. Patient reports uterine contraction  activity as occasional . Patient reports  vaginal bleeding as none. Patient describes fluid per vagina as Clear.  Vitals:  Blood pressure 112/72, pulse 102, temperature 97.9 F (36.6 C), temperature source Oral, resp. rate 18, height 5' (1.524 m), weight 158 lb 1.6 oz (71.714 kg), last menstrual period 02/04/2015, SpO2 100 %. Physical Examination:  General appearance - alert, well appearing, and in no distress Heart - normal rate and regular rhythm Abdomen - soft, nontender, gravid Fundal Height:  size equals dates Cervical Exam: Not evaluated. Extremities: extremities normal, atraumatic, no cyanosis or edema and Homans sign is negative, no sign of DVT with DTRs 2+ bilaterally Membranes:ruptured  Fetal Monitoring:  Baseline 145, moderate variability, +accels, no decels  Labs:  Results for orders placed or performed during the hospital encounter of 07/04/15 (from the past 24 hour(s))  [facility] [place]   Collection Time: 07/29/15 12:30 PM  Result Value Ref Range   ABO/RH(D) O POS    Antibody Screen NEG    Sample Expiration 08/01/2015     Medications:  Scheduled . aspirin  81 mg Oral QHS  . citalopram  20 mg Oral QHS  . docusate sodium  100 mg Oral Daily  . hydroxychloroquine  400 mg Oral QHS  . prenatal multivitamin  1 tablet Oral Q1200   I have reviewed the patient's current medications.  ASSESSMENT: Principal Problem:   Preterm premature rupture of membranes (PPROM) with unknown onset of labor Active Problems:   Previous cesarean  section   Short cervix, antepartum   Abnormal O'Sullivan glucose challenge test, antepartum   Cervical shortening in second trimester   Lupus (HCC)   PLAN: 1.  PPROM Stable. S/p latency antibiotics No si/sx intrauterine infection Continue NST   Micco Bourbeau, MD 07/30/2015,7:18 AM

## 2015-07-31 DIAGNOSIS — O42912 Preterm premature rupture of membranes, unspecified as to length of time between rupture and onset of labor, second trimester: Secondary | ICD-10-CM

## 2015-07-31 DIAGNOSIS — O26872 Cervical shortening, second trimester: Secondary | ICD-10-CM

## 2015-07-31 NOTE — Progress Notes (Signed)
Patient ID: Anne Mcdowell, female   DOB: 05/06/1986, 29 y.o.   MRN: [id number] Patient ID: Anne Mcdowell, female   DOB: 02/16/1986, 29 y.o.   MRN: [id number] FACULTY PRACTICE ANTEPARTUM(COMPREHENSIVE) NOTE  Anne Mcdowell is a 29 y.o. [phone] at 7222w3d  by early ultrasound who is admitted for PPROM, incompetent cervix.   Fetal presentation is cephalic. Length of Stay:  27  Days  Subjective: No complaints Patient reports the fetal movement as active. Patient reports uterine contraction  activity as occasional . Patient reports  vaginal bleeding as none. Patient describes fluid per vagina as Clear.  Vitals:  Blood pressure 110/71, pulse 91, temperature 98.7 F (37.1 C), temperature source Oral, resp. rate 20, height 5' (1.524 m), weight 158 lb 1.6 oz (71.714 kg), last menstrual period 02/04/2015, SpO2 100 %. Physical Examination:  General appearance - alert, well appearing, and in no distress Heart - normal rate and regular rhythm Abdomen - soft, nontender, gravid Fundal Height:  size equals dates Cervical Exam: Not evaluated. Extremities: extremities normal, atraumatic, no cyanosis or edema and Homans sign is negative, no sign of DVT with DTRs 2+ bilaterally Membranes:ruptured  Fetal Monitoring:  Baseline 145, moderate variability, +accels, no decels  Labs:  No results found for this or any previous visit (from the past 24 hour(s)).  Medications:  Scheduled . aspirin  81 mg Oral QHS  . citalopram  20 mg Oral QHS  . docusate sodium  100 mg Oral Daily  . hydroxychloroquine  400 mg Oral QHS  . prenatal multivitamin  1 tablet Oral Q1200   I have reviewed the patient's current medications.  ASSESSMENT: Principal Problem:   Preterm premature rupture of membranes (PPROM) with unknown onset of labor Active Problems:   Previous cesarean section   Short cervix, antepartum   Abnormal O'Sullivan glucose challenge test, antepartum   Cervical shortening in second trimester   Lupus  (HCC)   PLAN: 1.  PPROM Stable. S/p latency antibiotics No si/sx intrauterine infection Continue NST   Lazaro ArmsLuther H Eure, MD 07/31/2015,8:12 AM

## 2015-08-01 DIAGNOSIS — O42919 Preterm premature rupture of membranes, unspecified as to length of time between rupture and onset of labor, unspecified trimester: Secondary | ICD-10-CM | POA: Diagnosis not present

## 2015-08-01 LAB — TYPE AND SCREEN
ABO/RH(D): O POS
ANTIBODY SCREEN: NEGATIVE

## 2015-08-01 NOTE — Progress Notes (Signed)
Attempted follow up visit with patient whom we have met prior.  Pt was resting.  Will try again. Please page as further needs arise.  Donald Prose. Elyn Peers, M.Div. [facility] Chaplain Pager [phone] Office [phone]   08/01/15 1206  Clinical Encounter Type  Visited With Patient  Visit Type Follow-up  Referral From Chaplain

## 2015-08-01 NOTE — Progress Notes (Signed)
FACULTY PRACTICE ANTEPARTUM(COMPREHENSIVE) NOTE  Anne Mcdowell is a 29 y.o. [phone] at 5670w0d by early ultrasound who is admitted for PPROM, incompetent cervix.   Fetal presentation is cephalic. Length of Stay:  28  Days  Subjective: No complaints.  Mood is okay.  Spends a lot of time sleeping because of boredom Patient reports the fetal movement as active. Patient reports uterine contraction  activity as occasional . Patient reports  vaginal bleeding as none. Patient describes fluid per vagina as Clear.  Vitals:  Blood pressure 119/69, pulse 80, temperature 98.6 F (37 C), temperature source Oral, resp. rate 18, height 5' (1.524 m), weight 158 lb 1.6 oz (71.714 kg), last menstrual period 02/04/2015, SpO2 100 %. Physical Examination:  General appearance - alert, well appearing, and in no distress Heart - normal rate and regular rhythm Abdomen - soft, nontender, nondistended Fundal Height:  size equals dates Cervical Exam: Not evaluated. Extremities: extremities normal, atraumatic, no cyanosis or edema and Homans sign is negative, no sign of DVT with DTRs 2+ bilaterally Membranes:ruptured  Fetal Monitoring:  Baseline 145, moderate variability, +accels, no decels  Labs:  No results found for this or any previous visit (from the past 24 hour(s)).  Medications:  Scheduled . aspirin  81 mg Oral QHS  . citalopram  20 mg Oral QHS  . docusate sodium  100 mg Oral Daily  . hydroxychloroquine  400 mg Oral QHS  . prenatal multivitamin  1 tablet Oral Q1200   I have reviewed the patient's current medications.  ASSESSMENT: Principal Problem:   Preterm premature rupture of membranes (PPROM) with unknown onset of labor Active Problems:   Previous cesarean section   Short cervix, antepartum   Abnormal O'Sullivan glucose challenge test, antepartum   Cervical shortening in second trimester   Lupus (HCC)   PLAN: 1.  PPROM Stable. S/p latency antibiotics No si/sx intrauterine  infection Continue NST   Anne HeritageJacob J Derriana Oser, DO 08/01/2015,8:04 AM

## 2015-08-02 DIAGNOSIS — O42912 Preterm premature rupture of membranes, unspecified as to length of time between rupture and onset of labor, second trimester: Secondary | ICD-10-CM

## 2015-08-02 DIAGNOSIS — O26872 Cervical shortening, second trimester: Secondary | ICD-10-CM

## 2015-08-02 NOTE — Plan of Care (Signed)
Problem: Physical Regulation: Goal: Will remain free from infection Outcome: Progressing No reports of s/s of infection during shift.

## 2015-08-02 NOTE — Progress Notes (Signed)
Patient ID: Anne Mcdowell, female   DOB: 02/16/1986, 29 y.o.   MRN: [id number] FACULTY PRACTICE ANTEPARTUM(COMPREHENSIVE) NOTE  Anne Mcdowell is a 29 y.o. [phone] at 6952w5d  by early ultrasound who is admitted for PPROM, incompetent cervix.   Fetal presentation is cephalic. Length of Stay:  29  Days  Subjective: No complaints.  Mood is okay.  Spends a lot of time sleeping because of boredom Patient reports the fetal movement as active. Patient reports uterine contraction  activity as occasional . Patient reports  vaginal bleeding as none. Patient describes fluid per vagina as Clear.  Vitals:  Blood pressure 122/70, pulse 96, temperature 98.4 F (36.9 C), temperature source Oral, resp. rate 18, height 5' (1.524 m), weight 158 lb 1.6 oz (71.714 kg), last menstrual period 02/04/2015, SpO2 100 %. Physical Examination:  General appearance - alert, well appearing, and in no distress Heart - normal rate and regular rhythm Abdomen - soft, nontender, nondistended Fundal Height:  size equals dates Cervical Exam: Not evaluated. Extremities: extremities normal, atraumatic, no cyanosis or edema and Homans sign is negative, no sign of DVT with DTRs 2+ bilaterally Membranes:ruptured  Fetal Monitoring:  Baseline 145, moderate variability, +accels, no decels  Labs:  Results for orders placed or performed during the hospital encounter of 07/04/15 (from the past 24 hour(s))  [facility] screen [facility] [place]   Collection Time: 08/01/15 12:00 PM  Result Value Ref Range   ABO/RH(D) O POS    Antibody Screen NEG    Sample Expiration 08/04/2015     Medications:  Scheduled . aspirin  81 mg Oral QHS  . citalopram  20 mg Oral QHS  . docusate sodium  100 mg Oral Daily  . hydroxychloroquine  400 mg Oral QHS  . prenatal multivitamin  1 tablet Oral Q1200   I have reviewed the patient's current medications.  ASSESSMENT: Principal Problem:   Preterm premature rupture of membranes (PPROM)  with unknown onset of labor Active Problems:   Previous cesarean section   Short cervix, antepartum   Abnormal O'Sullivan glucose challenge test, antepartum   Cervical shortening in second trimester   Lupus (HCC)   PLAN: 1.  PPROM Stable. S/p latency antibiotics No si/sx intrauterine infection Continue NST   Lazaro ArmsLuther H Kyeshia Zinn, MD 08/02/2015,7:50 AM

## 2015-08-03 DIAGNOSIS — O42919 Preterm premature rupture of membranes, unspecified as to length of time between rupture and onset of labor, unspecified trimester: Secondary | ICD-10-CM | POA: Diagnosis not present

## 2015-08-03 NOTE — Progress Notes (Signed)
Patient ID: Anne Mcdowell, female   DOB: 08/19/1986, 29 y.o.   MRN: [id number] FACULTY PRACTICE ANTEPARTUM(COMPREHENSIVE) NOTE  Anne Ballslicia C Kovack is a 29 y.o. [phone] at 3737w6d by best clinical estimate who is admitted for PROM.   Fetal presentation is cephalic. Length of Stay:  30  Days  Subjective: Doing well. No complaints Patient reports the fetal movement as active. Patient reports uterine contraction  activity as none. Patient reports  vaginal bleeding as none. Patient describes fluid per vagina as Clear.  Vitals:  Blood pressure 110/69, pulse 93, temperature 98.5 F (36.9 C), temperature source Oral, resp. rate 18, height 5' (1.524 m), weight 158 lb 1.6 oz (71.714 kg), last menstrual period 02/04/2015, SpO2 100 %. Physical Examination:  General appearance - alert, well appearing, and in no distress Chest - normal effort Abdomen - gravid, NT Fundal Height:  size equals dates Extremities: Homans sign is negative, no sign of DVT  Membranes:ruptured, clear fluid  Fetal Monitoring:  Baseline: 145 bpm, Variability: Good {> 6 bpm), Accelerations: Non-reactive but appropriate for gestational age and Decelerations: Absent  Medications:  Scheduled . aspirin  81 mg Oral QHS  . citalopram  20 mg Oral QHS  . docusate sodium  100 mg Oral Daily  . hydroxychloroquine  400 mg Oral QHS  . prenatal multivitamin  1 tablet Oral Q1200   I have reviewed the patient's current medications.  ASSESSMENT: Principal Problem:   Preterm premature rupture of membranes (PPROM) with unknown onset of labor Active Problems:   Previous cesarean section   Short cervix, antepartum   Abnormal O'Sullivan glucose challenge test, antepartum   Cervical shortening in second trimester   Lupus (HCC)  Stable PPROM Abnl 1 hour, with normal 3 hour PLAN: Continue inpatient stay with delivery with s/sx's of PTL  [name], MD 08/03/2015,7:40 AM

## 2015-08-03 NOTE — Progress Notes (Signed)
Pt request to rest uninterrupted this shift and declines VS every 4 hours. RN informs pt of safety rounds and that RN will not disturb/awaken pt during rounds. Pt agrees to safety rounds.

## 2015-08-03 NOTE — Progress Notes (Signed)
RN to bedside for PM assessment. Plan of care discussed and pt states that her routine of care is to be assessment and have EFM applied nightly at 2130 with medication administration. No S/S of maternal distress noted. RN to complete assessment, VS, and fetal monitoring at 2130. Pt request to have food reheated and denies further needs at this time.

## 2015-08-04 LAB — TYPE AND SCREEN
ABO/RH(D): O POS
Antibody Screen: NEGATIVE

## 2015-08-04 NOTE — Progress Notes (Signed)
Report given and care relinquished to Elmyra RicksS. Kim, RN.

## 2015-08-04 NOTE — Progress Notes (Signed)
Patient ID: Anne Mcdowell, female   DOB: 04/07/1986, 29 y.o.   MRN: [id number] Patient ID: Anne Mcdowell, female   DOB: 04/02/1986, 29 y.o.   MRN: [id number] FACULTY PRACTICE ANTEPARTUM(COMPREHENSIVE) NOTE  Anne Mcdowell is a 29 y.o. [phone] at 6190w0d  by best clinical estimate who is admitted for PROM.   Fetal presentation is cephalic. Length of Stay:  31  Days  Subjective: Doing well. No complaints Patient reports the fetal movement as active. Patient reports uterine contraction  activity as none. Patient reports  vaginal bleeding as none. Patient describes fluid per vagina as Clear.  Vitals:  Blood pressure 115/67, pulse 88, temperature 98 F (36.7 C), temperature source Oral, resp. rate 15, height 5' (1.524 m), weight 158 lb 1.6 oz (71.714 kg), last menstrual period 02/04/2015, SpO2 100 %. Physical Examination:  General appearance - alert, well appearing, and in no distress Chest - normal effort Abdomen - gravid, NT Fundal Height:  size equals dates Extremities: Homans sign is negative, no sign of DVT  Membranes:ruptured, clear fluid  Fetal Monitoring:  Baseline: 145 bpm, Variability: Good {> 6 bpm), Accelerations: Non-reactive but appropriate for gestational age and Decelerations: Absent  Medications:  Scheduled . aspirin  81 mg Oral QHS  . citalopram  20 mg Oral QHS  . docusate sodium  100 mg Oral Daily  . hydroxychloroquine  400 mg Oral QHS  . prenatal multivitamin  1 tablet Oral Q1200   I have reviewed the patient's current medications.  ASSESSMENT: Principal Problem:   Preterm premature rupture of membranes (PPROM) with unknown onset of labor Active Problems:   Previous cesarean section   Short cervix, antepartum   Abnormal O'Sullivan glucose challenge test, antepartum   Cervical shortening in second trimester   Lupus (HCC)  Stable PPROM Abnl 1 hour, with normal 3 hour PLAN: Continue inpatient stay with delivery with s/sx's of PTL  EURE,LUTHER H,  MD 08/04/2015,7:19 AM

## 2015-08-04 NOTE — Plan of Care (Signed)
Problem: Phase I Progression Outcomes Goal: No significant worsening bleeding/cervix change/vag drainage No significant worsening in vaginal bleeding, cervical change, or vaginal drainage.  Outcome: Not Progressing Mild discharge this AM Goal: Pain controlled with appropriate interventions Outcome: Not Progressing Pt encouraged to drink fluids to decrease uterine irritability.

## 2015-08-05 ENCOUNTER — Inpatient Hospital Stay (HOSPITAL_COMMUNITY): Payer: Medicaid Other | Admitting: Anesthesiology

## 2015-08-05 ENCOUNTER — Encounter (HOSPITAL_COMMUNITY): Payer: Self-pay

## 2015-08-05 DIAGNOSIS — M329 Systemic lupus erythematosus, unspecified: Secondary | ICD-10-CM

## 2015-08-05 DIAGNOSIS — O42912 Preterm premature rupture of membranes, unspecified as to length of time between rupture and onset of labor, second trimester: Secondary | ICD-10-CM

## 2015-08-05 DIAGNOSIS — O34211 Maternal care for low transverse scar from previous cesarean delivery: Secondary | ICD-10-CM

## 2015-08-05 DIAGNOSIS — E669 Obesity, unspecified: Secondary | ICD-10-CM

## 2015-08-05 DIAGNOSIS — Z3A29 29 weeks gestation of pregnancy: Secondary | ICD-10-CM

## 2015-08-05 LAB — CBC
HEMATOCRIT: 33.3 % — AB (ref 36.0–46.0)
Hemoglobin: 11.2 g/dL — ABNORMAL LOW (ref 12.0–15.0)
MCH: 25.3 pg — ABNORMAL LOW (ref 26.0–34.0)
MCHC: 33.6 g/dL (ref 30.0–36.0)
MCV: 75.2 fL — AB (ref 78.0–100.0)
PLATELETS: 142 10*3/uL — AB (ref 150–400)
RBC: 4.43 MIL/uL (ref 3.87–5.11)
RDW: 13.8 % (ref 11.5–15.5)
WBC: 11.1 10*3/uL — AB (ref 4.0–10.5)

## 2015-08-05 MED ORDER — SIMETHICONE 80 MG PO CHEW: 80.0000 mg | CHEWABLE_TABLET | ORAL | Status: DC | PRN

## 2015-08-05 MED ORDER — LACTATED RINGERS IV SOLN
INTRAVENOUS | Status: DC
  Administered 2015-08-05: 100 mL/h via INTRAVENOUS

## 2015-08-05 MED ORDER — BETAMETHASONE SOD PHOS & ACET 6 (3-3) MG/ML IJ SUSP
12.0000 mg | INTRAMUSCULAR | Status: DC
  Administered 2015-08-05: 12 mg via INTRAMUSCULAR
  Filled 2015-08-05: qty 2

## 2015-08-05 MED ORDER — MAGNESIUM SULFATE BOLUS VIA INFUSION
4.0000 g | Freq: Once | INTRAVENOUS | Status: DC
  Filled 2015-08-05: qty 500

## 2015-08-05 MED ORDER — CITRIC ACID-SODIUM CITRATE 334-500 MG/5ML PO SOLN
30.0000 mL | ORAL | Status: DC | PRN
Start: 2015-08-05 — End: 2015-08-05

## 2015-08-05 MED ORDER — MAGNESIUM SULFATE 4 GM/100ML IV SOLN: 4.0000 g | Freq: Once | INTRAVENOUS | Status: DC

## 2015-08-05 MED ORDER — BENZOCAINE-MENTHOL 20-0.5 % EX AERO: 1.0000 "application " | INHALATION_SPRAY | CUTANEOUS | Status: DC | PRN

## 2015-08-05 MED ORDER — ZOLPIDEM TARTRATE 5 MG PO TABS: 5.0000 mg | ORAL_TABLET | Freq: Every evening | ORAL | Status: DC | PRN

## 2015-08-05 MED ORDER — ONDANSETRON HCL 4 MG/2ML IJ SOLN: 4.0000 mg | INTRAMUSCULAR | Status: DC | PRN

## 2015-08-05 MED ORDER — LIDOCAINE HCL (PF) 1 % IJ SOLN
30.0000 mL | INTRAMUSCULAR | Status: DC | PRN
  Filled 2015-08-05: qty 30

## 2015-08-05 MED ORDER — OXYCODONE-ACETAMINOPHEN 5-325 MG PO TABS
1.0000 | ORAL_TABLET | ORAL | Status: DC | PRN
  Administered 2015-08-06 – 2015-08-07 (×3): 1 via ORAL
  Filled 2015-08-05 (×3): qty 1

## 2015-08-05 MED ORDER — HYDROXYCHLOROQUINE SULFATE 200 MG PO TABS
400.0000 mg | ORAL_TABLET | Freq: Every day | ORAL | Status: DC
  Administered 2015-08-05 – 2015-08-06 (×2): 400 mg via ORAL
  Filled 2015-08-05 (×2): qty 2

## 2015-08-05 MED ORDER — OXYCODONE-ACETAMINOPHEN 5-325 MG PO TABS: 1.0000 | ORAL_TABLET | ORAL | Status: DC | PRN

## 2015-08-05 MED ORDER — DIBUCAINE 1 % RE OINT
1.0000 | TOPICAL_OINTMENT | RECTAL | Status: DC | PRN
Start: 2015-08-05 — End: 2015-08-07

## 2015-08-05 MED ORDER — BUTORPHANOL TARTRATE 1 MG/ML IJ SOLN
INTRAMUSCULAR | Status: AC
  Filled 2015-08-05: qty 1

## 2015-08-05 MED ORDER — TETANUS-DIPHTH-ACELL PERTUSSIS 5-2.5-18.5 LF-MCG/0.5 IM SUSP: 0.5000 mL | Freq: Once | INTRAMUSCULAR | Status: DC

## 2015-08-05 MED ORDER — LACTATED RINGERS IV SOLN: 500.0000 mL | INTRAVENOUS | Status: DC | PRN

## 2015-08-05 MED ORDER — ACETAMINOPHEN 325 MG PO TABS: 650.0000 mg | ORAL_TABLET | ORAL | Status: DC | PRN

## 2015-08-05 MED ORDER — BUTORPHANOL TARTRATE 1 MG/ML IJ SOLN
1.0000 mg | Freq: Once | INTRAMUSCULAR | Status: AC
  Administered 2015-08-05: 1 mg via INTRAVENOUS

## 2015-08-05 MED ORDER — OXYCODONE-ACETAMINOPHEN 5-325 MG PO TABS: 2.0000 | ORAL_TABLET | ORAL | Status: DC | PRN

## 2015-08-05 MED ORDER — OXYTOCIN 40 UNITS IN LACTATED RINGERS INFUSION - SIMPLE MED
62.5000 mL/h | INTRAVENOUS | Status: DC
  Administered 2015-08-05: 62.5 mL/h via INTRAVENOUS
  Administered 2015-08-05: 500 mL/h via INTRAVENOUS
  Filled 2015-08-05: qty 1000

## 2015-08-05 MED ORDER — EPHEDRINE 5 MG/ML INJ
10.0000 mg | INTRAVENOUS | Status: DC | PRN
  Filled 2015-08-05: qty 2

## 2015-08-05 MED ORDER — LANOLIN HYDROUS EX OINT: TOPICAL_OINTMENT | CUTANEOUS | Status: DC | PRN

## 2015-08-05 MED ORDER — ONDANSETRON HCL 4 MG/2ML IJ SOLN
4.0000 mg | Freq: Four times a day (QID) | INTRAMUSCULAR | Status: DC | PRN
  Administered 2015-08-05: 4 mg via INTRAVENOUS
  Filled 2015-08-05: qty 2

## 2015-08-05 MED ORDER — SENNOSIDES-DOCUSATE SODIUM 8.6-50 MG PO TABS
2.0000 | ORAL_TABLET | ORAL | Status: DC
  Administered 2015-08-05 – 2015-08-06 (×2): 2 via ORAL
  Filled 2015-08-05 (×2): qty 2

## 2015-08-05 MED ORDER — ONDANSETRON HCL 4 MG PO TABS: 4.0000 mg | ORAL_TABLET | ORAL | Status: DC | PRN

## 2015-08-05 MED ORDER — MAGNESIUM SULFATE 50 % IJ SOLN
2.0000 g/h | INTRAVENOUS | Status: DC
  Filled 2015-08-05: qty 80

## 2015-08-05 MED ORDER — CITALOPRAM HYDROBROMIDE 20 MG PO TABS
20.0000 mg | ORAL_TABLET | Freq: Every day | ORAL | Status: DC
  Administered 2015-08-05 – 2015-08-06 (×2): 20 mg via ORAL
  Filled 2015-08-05 (×2): qty 1

## 2015-08-05 MED ORDER — PRENATAL MULTIVITAMIN CH
1.0000 | ORAL_TABLET | Freq: Every day | ORAL | Status: DC
  Administered 2015-08-06: 1 via ORAL
  Filled 2015-08-05: qty 1

## 2015-08-05 MED ORDER — FENTANYL CITRATE (PF) 100 MCG/2ML IJ SOLN
100.0000 ug | INTRAMUSCULAR | Status: DC | PRN
  Administered 2015-08-05: 100 ug via INTRAVENOUS
  Filled 2015-08-05: qty 2

## 2015-08-05 MED ORDER — LACTATED RINGERS IV SOLN: INTRAVENOUS | Status: DC

## 2015-08-05 MED ORDER — IBUPROFEN 600 MG PO TABS
600.0000 mg | ORAL_TABLET | Freq: Four times a day (QID) | ORAL | Status: DC
  Administered 2015-08-05 – 2015-08-07 (×7): 600 mg via ORAL
  Filled 2015-08-05 (×7): qty 1

## 2015-08-05 MED ORDER — DIPHENHYDRAMINE HCL 50 MG/ML IJ SOLN: 12.5000 mg | INTRAMUSCULAR | Status: DC | PRN

## 2015-08-05 MED ORDER — PHENYLEPHRINE 40 MCG/ML (10ML) SYRINGE FOR IV PUSH (FOR BLOOD PRESSURE SUPPORT)
80.0000 ug | PREFILLED_SYRINGE | INTRAVENOUS | Status: DC | PRN
Start: 2015-08-05 — End: 2015-08-05
  Filled 2015-08-05: qty 2
  Filled 2015-08-05: qty 20

## 2015-08-05 MED ORDER — MAGNESIUM SULFATE 50 % IJ SOLN
2.0000 g/h | INTRAVENOUS | Status: DC
  Administered 2015-08-05: 4 g/h via INTRAVENOUS
  Filled 2015-08-05: qty 80

## 2015-08-05 MED ORDER — OXYTOCIN BOLUS FROM INFUSION: 500.0000 mL | INTRAVENOUS | Status: DC

## 2015-08-05 MED ORDER — LIDOCAINE HCL (PF) 1 % IJ SOLN
INTRAMUSCULAR | Status: DC | PRN
  Administered 2015-08-05 (×2): 4 mL

## 2015-08-05 MED ORDER — DIPHENHYDRAMINE HCL 25 MG PO CAPS: 25.0000 mg | ORAL_CAPSULE | Freq: Four times a day (QID) | ORAL | Status: DC | PRN

## 2015-08-05 MED ORDER — WITCH HAZEL-GLYCERIN EX PADS: 1.0000 "application " | MEDICATED_PAD | CUTANEOUS | Status: DC | PRN

## 2015-08-05 MED ORDER — FENTANYL 2.5 MCG/ML BUPIVACAINE 1/10 % EPIDURAL INFUSION (WH - ANES)
14.0000 mL/h | INTRAMUSCULAR | Status: DC | PRN
Start: 2015-08-05 — End: 2015-08-05
  Administered 2015-08-05: 14 mL/h via EPIDURAL
  Administered 2015-08-05: 12 mL/h via EPIDURAL
  Filled 2015-08-05: qty 125

## 2015-08-05 NOTE — Anesthesia Preprocedure Evaluation (Addendum)
Anesthesia Evaluation  Patient identified by MRN, date of birth, ID band Patient awake    Reviewed: Allergy & Precautions, NPO status , Patient's Chart, lab work & pertinent test results  History of Anesthesia Complications Negative for: history of anesthetic complications  Airway Mallampati: II  TM Distance: >3 FB Neck ROM: Full    Dental no notable dental hx. (+) Dental Advisory Given   Pulmonary neg pulmonary ROS,    Pulmonary exam normal breath sounds clear to auscultation       Cardiovascular negative cardio ROS Normal cardiovascular exam Rhythm:Regular Rate:Normal     Neuro/Psych negative neurological ROS  negative psych ROS   GI/Hepatic negative GI ROS, Neg liver ROS,   Endo/Other  Obesity, lupus  Renal/GU negative Renal ROS  negative genitourinary   Musculoskeletal negative musculoskeletal ROS (+)   Abdominal   Peds negative pediatric ROS (+)  Hematology negative hematology ROS (+)   Anesthesia Other Findings   Reproductive/Obstetrics (+) Pregnancy                             Anesthesia Physical Anesthesia Plan  ASA: III  Anesthesia Plan: Epidural   Post-op Pain Management:    Induction:   Airway Management Planned:   Additional Equipment:   Intra-op Plan:   Post-operative Plan:   Informed Consent: I have reviewed the patients History and Physical, chart, labs and discussed the procedure including the risks, benefits and alternatives for the proposed anesthesia with the patient or authorized representative who has indicated his/her understanding and acceptance.   Dental advisory given  Plan Discussed with: CRNA  Anesthesia Plan Comments: (Patient states she is only taking aspirin to thin her blood. No anticoagulants or other antiplatelets)       Anesthesia Quick Evaluation

## 2015-08-05 NOTE — Progress Notes (Signed)
Patient ID: Anne Mcdowell, female   DOB: 08/04/1986, 29 y.o.   MRN: [id number] FACULTY PRACTICE ANTEPARTUM(COMPREHENSIVE) NOTE  Anne Mcdowell is a 29 y.o. [phone] at 6553w6d by best clinical estimate who is admitted for PROM.   Fetal presentation is cephalic. Length of Stay:  32  Days  Subjective: Patient started feeling contractions at 4:30 am.  Contractions moderate.  Approximately every 4 minutes.  No other abdominal pain.  Denies fever, chills, nausea, vomiting, foul vaginal discharge. Patient reports the fetal movement as active. Patient reports uterine contraction  activity as none. Patient reports  vaginal bleeding as none. Patient describes fluid per vagina as Clear.  Vitals:  Blood pressure 109/71, pulse 97, temperature 98.5 F (36.9 C), temperature source Oral, resp. rate 18, height 5' (1.524 m), weight 158 lb 1.6 oz (71.714 kg), last menstrual period 02/04/2015, SpO2 100 %. Physical Examination:  General appearance - alert, well appearing, and in no distress Chest - normal effort Abdomen - gravid, NT Fundal Height:  size equals dates Extremities: Homans sign is negative, no sign of DVT  Membranes:ruptured, clear fluid Dilation: Fingertip Effacement (%): Thick Cervical Position: Middle Exam by:: Dr Adrian BlackwaterStinson   Fetal Monitoring:  Baseline: 145 bpm, Variability: Good {> 6 bpm), Accelerations: Non-reactive but appropriate for gestational age and Decelerations: Absent  Medications:  Scheduled . aspirin  81 mg Oral QHS  . betamethasone acetate-betamethasone sodium phosphate  12 mg Intramuscular Q24 Hr x 2  . citalopram  20 mg Oral QHS  . docusate sodium  100 mg Oral Daily  . hydroxychloroquine  400 mg Oral QHS  . magnesium  4 g Intravenous Once  . prenatal multivitamin  1 tablet Oral Q1200   I have reviewed the patient's current medications.  ASSESSMENT: Principal Problem:   Preterm premature rupture of membranes (PPROM) with unknown onset of labor Active Problems:  Previous cesarean section   Short cervix, antepartum   Abnormal O'Sullivan glucose challenge test, antepartum   Cervical shortening in second trimester   Lupus (HCC)  Stable PPROM Abnl 1 hour, with normal 3 hour PLAN: 1.  PPROM  Start IV  Patient to empty bladder and start IVF  Repeat BMZ  If in labor, will need to restart magnesium for neuro-protection  STINSON, JACOB JEHIEL, DO 08/05/2015,6:23 AM

## 2015-08-05 NOTE — Progress Notes (Signed)
Patient ID: Anne Mcdowell, female   DOB: 02/16/1986, 29 y.o.   MRN: [id number]  Patient having increased ctx to q2-3.  Will transfer to L&D.  Levie HeritageJacob J Stinson, DO 08/05/2015 8:08 AM

## 2015-08-05 NOTE — Lactation Note (Signed)
This note was copied from the chart of Anne Mcdowell. Lactation Consultation Note  Patient Name: Anne Reece Packerlicia Boan XBMWU'XToday's Date: 08/05/2015 Reason for consult: Initial assessment;NICU baby  NICU mom 4 hours old. Mom states that she did not BF first child, but she does remember her milk "coming in." Assisted mom with hand expression, but no colostrum present yet. Assisted mom to begin using DEBP. Enc mom to pump 8 times/24 hours for 15 followed by hand expression. Enc mom to sleep tonight, then pump more often in the morning. Mom given all supplies, NICU booklet, and LC brochure with review. Mom aware of breast milk storage guidelines, and knows about pumping rooms in NICU. Enc mom to take colostrum to NICU as able. [facility] and wants to see about getting a pump. Mom gave permission to [facility], and referral was faxed over. Enc STS/Kangaroo care and nursing/nuzzling at breast as she and baby able.  Maternal Data Has patient been taught Hand Expression?: Yes Does the patient have breastfeeding experience prior to this delivery?: No  Feeding    LATCH Score/Interventions                      Lactation Tools Discussed/Used WIC Program: Yes Pump Review: Setup, frequency, and cleaning;Milk Storage Initiated by:: JW Date initiated:: 08/05/15   Consult Status Consult Status: Follow-up Date: 08/06/15 Follow-up type: In-patient    Geralynn OchsWILLIARD, Keishawn Darsey 08/05/2015, 4:15 PM

## 2015-08-05 NOTE — Anesthesia Procedure Notes (Signed)
Epidural Patient location during procedure: OB  Staffing Anesthesiologist: Reet Scharrer Performed by: anesthesiologist   Preanesthetic Checklist Completed: patient identified, site marked, surgical consent, pre-op evaluation, timeout performed, IV checked, risks and benefits discussed and monitors and equipment checked  Epidural Patient position: sitting Prep: site prepped and draped and DuraPrep Patient monitoring: continuous pulse ox and blood pressure Approach: midline Location: L3-L4 Injection technique: LOR saline  Needle:  Needle type: Tuohy  Needle gauge: 17 G Needle length: 9 cm and 9 Needle insertion depth: 5 cm cm Catheter type: closed end flexible Catheter size: 19 Gauge Catheter at skin depth: 10 cm Test dose: negative  Assessment Events: blood not aspirated, injection not painful, no injection resistance, negative IV test and no paresthesia  Additional Notes Patient identified. Risks/Benefits/Options discussed with patient including but not limited to bleeding, infection, nerve damage, paralysis, failed block, incomplete pain control, headache, blood pressure changes, nausea, vomiting, reactions to medication both or allergic, itching and postpartum back pain. Confirmed with bedside nurse the patient's most recent platelet count. Confirmed with patient that they are not currently taking any anticoagulation, have any bleeding history or any family history of bleeding disorders. Patient expressed understanding and wished to proceed. All questions were answered. Sterile technique was used throughout the entire procedure. Please see nursing notes for vital signs. Test dose was given through epidural catheter and negative prior to continuing to dose epidural or start infusion. Warning signs of high block given to the patient including shortness of breath, tingling/numbness in hands, complete motor block, or any concerning symptoms with instructions to call for help. Patient was  given instructions on fall risk and not to get out of bed. All questions and concerns addressed with instructions to call with any issues or inadequate analgesia.      

## 2015-08-05 NOTE — Anesthesia Postprocedure Evaluation (Signed)
Anesthesia Post Note  Patient: Anne Mcdowell  Procedure(s) Performed: * No procedures listed *  Anesthesia type: Epidural  Patient location: Mother/Baby  Post pain: Pain level controlled  Post assessment: Post-op Vital signs reviewed  Last Vitals:  Filed Vitals:   08/05/15 1500  BP: 105/71  Pulse: 86  Temp: 36.9 C  Resp: 18    Post vital signs: Reviewed  Level of consciousness:alert  Complications: No apparent anesthesia complications

## 2015-08-06 LAB — RPR: RPR: NONREACTIVE

## 2015-08-06 NOTE — Progress Notes (Signed)
Post Partum Day 1 Subjective:  Almedia Ballslicia C Knoff is a 29 y.o. [phone] [id number] 8365w1d s/p VBAC.  No acute events overnight.  Pt denies problems with ambulating, voiding or po intake.  She denies nausea or vomiting.  Pain is well controlled.  She has had flatus.  Lochia Minimal.  Plan for birth control is undetermined.  Method of feeding is breast  Objective: Blood pressure 102/66, pulse 74, temperature 98.8 F (37.1 C), temperature source Oral, resp. rate 16, height 5' (1.524 m), weight 71.714 kg (158 lb 1.6 oz), last menstrual period 02/04/2015, SpO2 100 %, unknown if currently breastfeeding.  Physical Exam:  General: alert, cooperative and no distress Lochia: normal flow Chest: normal WOB, clear to auscultation on frontal lung fields Heart: regular rate and rhythm, S1S2 heard Abdomen: +BS, appropriately tender Uterine Fundus: firm, located at level of umbilicus Extremities: no pitting edema   Recent Labs  08/05/15 0745  HGB 11.2*  HCT 33.3*    Assessment/Plan:  ASSESSMENT: Almedia Ballslicia C Lindahl is a 29 y.o. [id number] 7965w1d s/p VBAC.  Plan for discharge tomorrow Continue routine PP care Breastfeeding support PRN   LOS: 33 days   Gabriel RungKristen Glennon Kopko, MS3 08/06/2015, 8:19 AM

## 2015-08-06 NOTE — Progress Notes (Signed)
Follow up visit with patient previously known to chaplain from her stay on antenatal.  Pt shared that she is feeling okay about the delivery of her baby and is encouraged that she is doing well and eating despite her small size.  Pt shared that she is somewhat apprehensive about leaving baby Anne Mcdowell tomorrow but is hopeful because she lives close by.  Pt is looking forward to being able to be with her 758 yo son more, which was difficult during her long stay on antenatal.  Pt is aware of how to contact a chaplain and that we will continue to follow and support her throughout Naveah's stay in NICU.  Please page as further needs arise.  Anne Mcdowell, M.Div. [facility] Chaplain Pager [phone] Office [phone]   08/06/15 1215  Clinical Encounter Type  Visited With Patient  Visit Type Follow-up;Psychological support  Referral From Chaplain  Spiritual Encounters  Spiritual Needs Emotional  Stress Factors  Patient Stress Factors Major life changes

## 2015-08-06 NOTE — Progress Notes (Signed)
POSTPARTUM PROGRESS NOTE  Post Partum Day 1 Subjective:  Anne Mcdowell is a 29 y.o. [phone] [id number] 3546w1d s/p nsvd. Successful vbac. PPROM, infant in nicu.  No acute events overnight.  Pt denies problems with ambulating, voiding or po intake.  She denies nausea or vomiting.  Pain is well controlled.  She has had flatus. She has not had bowel movement.  Lochia Small.   Objective: Blood pressure 106/58, pulse 78, temperature 98.6 F (37 C), temperature source Oral, resp. rate 16, height 5' (1.524 m), weight 158 lb 1.6 oz (71.714 kg), last menstrual period 02/04/2015, SpO2 95 %, unknown if currently breastfeeding.  Physical Exam:  General: alert, cooperative and no distress Lochia:normal flow Chest: CTAB Heart: RRR no m/r/g Abdomen: +BS, soft, nontender,  Uterine Fundus: firm,  DVT Evaluation: No calf swelling or tenderness Extremities: trace edema   Recent Labs  08/05/15 0745  HGB 11.2*  HCT 33.3*    Assessment/Plan:  ASSESSMENT: Anne Mcdowell is a 29 y.o. [id number] 3046w1d s/p nsvd, doing well. Baby in nicu. Wants pp btl, will have our administrator get her the appropriate paperwork and schedule PP appointment.  Plan for discharge tomorrow   LOS: 33 days   Silvano Bilisoah B Xandrea Clarey 08/06/2015, 12:44 PM

## 2015-08-06 NOTE — Lactation Note (Signed)
This note was copied from the chart of Anne Mcdowell. Lactation Consultation Note  Patient Name: Anne Reece Packerlicia Hertzog WUJWJ'XToday's Date: 08/06/2015 Reason for consult: Follow-up assessment;NICU baby  NICU baby 3127 hours old. Mom reports that she is getting several ml of EBM each time she pumps and hand expresses, and she is taking to baby in NICU. Mom states that she was able to swab baby's check with EBM earlier today. Enc mom to keep pumping and hand expressing at least 8 times/24 hours for 15 minutes and EBM amounts should continue to increase. Mom given additional colostrum bottles and enc to relax while pumping and not look at equipment but view pictures of baby on her phone. Enc mom to call for assistance as needed. Mom [facility] 08-07-15. Maternal Data    Feeding    LATCH Score/Interventions                      Lactation Tools Discussed/Used     Consult Status Consult Status: Follow-up Date: 08/07/15 Follow-up type: In-patient    Geralynn OchsWILLIARD, Faige Seely 08/06/2015, 3:57 PM

## 2015-08-07 MED ORDER — HYDROCODONE-ACETAMINOPHEN 5-325 MG PO TABS: 1.0000 | ORAL_TABLET | Freq: Four times a day (QID) | ORAL | Status: DC | PRN

## 2015-08-07 MED ORDER — IBUPROFEN 600 MG PO TABS: 600.0000 mg | ORAL_TABLET | Freq: Four times a day (QID) | ORAL | Status: DC

## 2015-08-07 MED ORDER — IBUPROFEN 600 MG PO TABS: 600.0000 mg | ORAL_TABLET | Freq: Four times a day (QID) | ORAL | Status: DC | PRN

## 2015-08-07 NOTE — Lactation Note (Addendum)
This note was copied from the chart of Anne Anne Mcdowell. Lactation Consultation Note  Patient Name: Anne Mcdowell MWUXL'KToday's Date: 08/07/2015 Reason for consult: Follow-up assessment;NICU baby  NICU baby 6445 hours old. [facility]. Mom has colostrum, almost 10 ml, labeled and ready for her to take to NICU as she leaves. Mom given additional bottles for milk collection, aware of how to transport EBM back to NICU, and aware of additional bottles in NICU pumping rooms. Mom states that she is able to obtain more EBM with hand expression. Enc mom to continue with her pumping routine. [facility] Mom aware of OP/BFSG and LC phone line assistance after D/C.  Maternal Data    Feeding    LATCH Score/Interventions                      Lactation Tools Discussed/Used     Consult Status Consult Status: PRN    Geralynn OchsWILLIARD, Lenard Kampf 08/07/2015, 9:55 AM

## 2015-08-07 NOTE — Progress Notes (Signed)
Post Partum Day 2 Subjective: no complaints, up ad lib, voiding and tolerating PO pt ambivalent re: bc plans,, bottle feeding, once AGAIN reviewed LARC options, vs btl. Pt wants LARC. Probable IUD use.  Objective: Blood pressure 107/64, pulse 69, temperature 98.3 F (36.8 C), temperature source Oral, resp. rate 18, height 5' (1.524 m), weight 158 lb 1.6 oz (71.714 kg), last menstrual period 02/04/2015, SpO2 100 %, unknown if currently breastfeeding.  Physical Exam:  General: alert, cooperative and no distress Lochia: appropriate Uterine Fundus: soft Incision:  DVT Evaluation: No evidence of DVT seen on physical exam.   Recent Labs  08/05/15 0745  HGB 11.2*  HCT 33.3*    Assessment/Plan: Discharge home pt to decide on iud vs nexplanon. No longer interested in btl.  LOS: 34 days   Anne Mcdowell V 08/07/2015, 6:52 AM

## 2015-08-07 NOTE — Progress Notes (Signed)
Pt is discharged in the care of husband,with N.T. Escort. Denies pain or discomfort. Discharged instructions with Rx were given to pt with good understanding. Questions were asked and answered. No equipment needed for home use. Infant to remain in  Nicu. Stable.

## 2015-08-07 NOTE — Discharge Summary (Signed)
Physician Discharge Summary  Patient ID: Anne Mcdowell MRN: [id number] DOB/AGE: 05/28/1986 29 y.o.  Admit date: 07/04/2015 Discharge date: 08/07/2015  Admission Diagnoses:HPI: Anne Mcdowell is a 29 y.o. female [phone] with IUP at [redacted]w[redacted]d presenting 10/14 for observation after [place] today showed no measurable cervix with pessary in place. She occasionally note vaginal discharge or possible leakage of urine.     Discharge Diagnoses: pregnancy 29 +1 wk delivered Active Problems:   Previous cesarean section   Abnormal O'Sullivan glucose challenge test, antepartum   Lupus [facility])   Discharged Condition: good  Hospital Course: Delivery Note At 12:08 PM a viable and preterm female was delivered via Vaginal, Spontaneous Delivery (Presentation: Left Occiput Anterior). APGAR: 4, 7; weight 2 lb 8.9 oz (1160 g).  Placenta status: Intact, Spontaneous. Cord: 3 vessels with the following complications: None.   Anesthesia: Epidural  Episiotomy: None Lacerations: Labial;1st degree Suture Repair: 4.0 monocryl Est. Blood Loss (mL): 300  Mom to postpartum. Baby to NICU.  Anne Mcdowell is a 29 y.o. female [phone] with IUP at [redacted]w[redacted]d admitted for preterm labor with history of LTCS x 1 and desire for TOLAC. She was s/p BMZ x 2 on admission with pessary placed. PPROM occurred on 10/27 while pt admitted. Pessary removed and latency abx given. Pt received 24 hour course of Magnesium Sulfate for neuroprotection during hospital course. She was transferred to [facility] with suspected active labor on 08/05/15. A second course of BMZ was started with one injection on 11/15 at 0530. Magnesium Sulfate was restarted for neuroprotection. Pt received epidural for pain relief, then felt urge to push and was 5 cm, then quickly 10 cm per RN exam. CNM called to room. NICU team called and arrived. Pt pushed ~10 minutes to deliver. Cord clamped and cut by CNM and infant taken to NICU team. Placenta intact and  spontaneous, bleeding minimal. First degree labial right and left lacerations. Left labial laceration repaired with 4.0 monocryl, right labial hemostatic and not repaired. Infant taken to NICU and FOB went with team to NICU. Mom stable prior to transfer to postpartum.    Consults: neonatology  Significant Diagnostic Studies: labs:  CBC Latest Ref Rng 08/05/2015 07/10/2015 07/04/2015  WBC 4.0 - 10.5 K/uL 11.1(H) 5.6 7.0  Hemoglobin 12.0 - 15.0 g/dL 11.2(L) 9.9(L) 11.1(L)  Hematocrit 36.0 - 46.0 % 33.3(L) 30.4(L) 33.7(L)  Platelets 150 - 400 K/uL 142(L) 165 232      Treatments: steroids: betamethasone x 2  Discharge Exam: Blood pressure 107/64, pulse 69, temperature 98.3 F (36.8 C), temperature source Oral, resp. rate 18, height 5' (1.524 m), weight 158 lb 1.6 oz (71.714 kg), last menstrual period 02/04/2015, SpO2 100 %, unknown if currently breastfeeding. General appearance: alert, cooperative and no distress Head: Normocephalic, without obvious abnormality, atraumatic GI: soft, non-tender; bowel sounds normal; no masses,  no organomegaly Extremities: extremities normal, atraumatic, no cyanosis or edema  Disposition: 01-Home or Self Care  Discharge Instructions    Diet - low sodium heart healthy    Complete by:  As directed      Increase activity slowly    Complete by:  As directed             Medication List    TAKE these medications        acetaminophen 500 MG tablet  Commonly known as:  TYLENOL  Take 500 mg by mouth every 6 (six) hours as needed for headache.     aspirin 81 MG tablet  Take 81 mg by mouth daily.     calcium carbonate 500 MG chewable tablet  Commonly known as:  TUMS - dosed in mg elemental calcium  Chew 2 tablets by mouth daily as needed for indigestion or heartburn.     citalopram 20 MG tablet  Commonly known as:  CELEXA  Take 20 mg by mouth at bedtime.     HYDROcodone-acetaminophen 5-325 MG tablet  Commonly known as:  NORCO/VICODIN   Take 1 tablet by mouth every 6 (six) hours as needed for moderate pain. May take with ibuprofen     hydroxychloroquine 200 MG tablet  Commonly known as:  PLAQUENIL  Take 400 mg by mouth at bedtime.     ibuprofen 600 MG tablet  Commonly known as:  ADVIL,MOTRIN  Take 1 tablet (600 mg total) by mouth every 6 (six) hours.     nitrofurantoin (macrocrystal-monohydrate) 100 MG capsule  Commonly known as:  MACROBID  Take 1 capsule (100 mg total) by mouth 2 (two) times daily.     prenatal multivitamin Tabs tablet  Take 1 tablet by mouth daily at 12 noon.     progesterone 200 MG capsule  Commonly known as:  PROMETRIUM  Place 1 capsule (200 mg total) vaginally at bedtime.         SignedTilda Burrow: Lida Berkery V 08/07/2015, 7:13 AM

## 2015-08-07 NOTE — Clinical Social Work Maternal (Signed)
CLINICAL SOCIAL WORK MATERNAL/CHILD NOTE  Patient Details  Name: Anne Mcdowell MRN: [id number] Date of Birth: [birth year]  Date:  08/07/2015  Clinical Social Worker Initiating Note:  Bert Ptacek E. Brigitte Pulse, [place] Date/ Time Initiated:  08/07/15/0930     Child's Name:  Anne Mcdowell   Legal Guardian:   (Parents: Lovena Neighbours and Arthur Holms)   Need for Interpreter:  None   Date of Referral:  08/07/15     Reason for Referral:  Other (Comment), Parental Support of Premature Babies < 32 weeks/or Critically Ill babies  ("SSI assistance, resources")   Referral Source:  RN   Address:  [address] [place], [place] [address]  Phone number:  [phone]   Household Members:  Minor Children (Couple has one other child, Company secretary Fuller/age 18)   Natural Supports (not living in the home):  Spouse/significant other, Immediate Family (MOB reports that FOB is involved and supportive.  MOB reports that her mother has been caring for their son while she was in the hospital for 5 weeks.)   Professional Supports:     Employment:     Type of Work:  (MOB works for [employer] and plans to return at some point.  She reports that FOB works in a [place].)   Education:      Museum/gallery curator Resources:  [employer]   Other Resources:      Cultural/Religious Considerations Which May Impact Care: None stated  Strengths:  Ability to meet basic needs , Compliance with medical plan , Understanding of illness (MOB states they are in the process of getting supplies for infant.  CSW informed them of resources available through [employer].  29 year old goes to [facility], but MOB plans to pick a new pediatrician for baby and transfer her son there)   Risk Factors/Current Problems:  None   Cognitive State:  Alert , Linear Thinking , Goal Oriented , Insightful    Mood/Affect:  Interested , Calm , Relaxed    CSW Assessment: CSW met with MOB in her third floor room/308 to introduce services,  offer support and complete assessment due to baby's admission to NICU at 29 weeks.  MOB was quiet, but pleasant and welcoming of CSW's visit.  MOB reports feeling well and states baby is also doing well at this time.  CSW inquired about her experience on the Antenatal Unit.  MOB reports being admitted for the past 5 weeks.  She describes that experience as "pretty rough," but adds, "I looked at it like the longer I'm here, the longer she is where she needs to be."  CSW commends her for her positive outlook and for doing everything in her power to help her baby.  CSW asked how she is feeling now that baby has arrived.  MOB reports feeling, "in shock," and states, "it all happened so fast."  She explained that once she was on bed rest and ruptured, she expected to have the baby at 34 weeks after being induced, since this was the plan.  She states she did not expect baby to come before that.  She feels she is coping well at this time.  CSW discussed how she may have more emotion in the coming days when she has more of an opportunity to process the week's events.  CSW discussed common emotions related the the birth of a baby and weeks following.  CSW provided education on perinatal mood disorders and inquired about any hx of mental illness/PPD with first child.  MOB states she has never dealt with anxiety or depression in the past and felt well after her son's birth 8 years ago.  CSW notes that MOB has a prescription for Celexa.  MOB agrees to talk with CSW and or her doctor if she has concerns about her mental health at any time.   FOB arrived at this time and MOB gave permission to continue talking with them.  CSW briefly reviewed signs and symptoms of PPD with FOB as well.  CSW reviewed safe sleep/SIDS precautions with parents who both state awareness.  FOB reports that he smokes, but not in the home.  Parents have one other child, Roxanne Mins, age 21, who MOB states is having a difficult time with the fact that he cannot  see his baby sister.  She states he is acting "angry and irritable.  He sat there with his shirt over his head the whole first day."  CSW acknowledged this difficult aspect of the situation and provided MOB information regarding support for siblings through [employer].  MOB sounded very interested.  CSW made referral to [facility]SN for sibling orientation and sibling support bag.  Parents state they do not have home ready for infant due to MOB's admission at such an early gestation, but will be able to get prepared in the coming months while baby is in the hospital.  CSW informed parents of resources through [employer] if they find themselves in need of assistance in this area. CSW explained baby's eligibility for SSI benefits and informed parents on the process if they wish to apply.  Parents are interested in applying.  CSW obtained MOB's signature on Authorization to [place] and provided parents with a copy of baby's admission summary.  Parents stated understanding that the hospital has no control over the application/approval process, but that baby qualifies per The Social Security Guidelines regarding gestational age and birth weight.   Parents report feeling well informed and updated by NICU staff.  CSW asked that they call CSW any time they would like a [employer] and to not be alarmed if we call them to schedule a [employer].  Parents stated appreciation and state no questions, concerns or needs at this time.  CSW Plan/Description:  Engineer, mining , Psychosocial Support and Ongoing Assessment of Needs, Information/Referral to [place], [place], [place] 08/07/2015, 2:01 PM

## 2015-08-11 ENCOUNTER — Ambulatory Visit: Payer: Self-pay

## 2015-08-11 NOTE — Lactation Note (Signed)
This note was copied from the chart of [name]. Lactation Consultation Note  Patient Name: [name]'s Date: 08/11/2015 Reason for consult: Follow-up assessment    With this mom of a NICU baby, now 566 days old, and 30 weeks CGA. Mom has been pumping and expresisng about 20 mo from her right breast, and just blood from her left. On exam, she has cracks around the base of her nipples. i advised her to use 24 flanges, 21 now too small, gaveher comfort gels, and advised her in their use, and advised her to use coconut oil when not using the gels, and to also use the oil with pumping to decrease friction. Mom also was not pumping at night, to I reviewed the importance of pumping at east 8 times a day, followed by hand expression, and how the first 14 days will impact her milk supply more than any other. Mom very receptive to my teaching, and appreciative of the comfort gels.   Maternal Data    Feeding Feeding Type: Breast Milk Length of feed: 20 min  LATCH Score/Interventions       Type of Nipple: Everted at rest and after stimulation  Comfort (Breast/Nipple): Engorged, cracked, bleeding, large blisters, severe discomfort  Problem noted: Severe discomfort;Cracked, bleeding, blisters, bruises Interventions  (Cracked/bleeding/bruising/blister): Expressed breast milk to nipple (comfort gels, cocont oil whe not using gels)        Lactation Tools Discussed/Used     Consult Status Consult Status: PRN Follow-up type: In-patient (NICU)    Anne Mcdowell LevinsLee, Anne Mcdowell Mcdowell Anne Mcdowell 08/11/2015, 4:39 PM

## 2015-08-13 ENCOUNTER — Ambulatory Visit: Payer: Self-pay

## 2015-08-13 NOTE — Lactation Note (Signed)
This note was copied from the chart of [name]. Lactation Consultation Note  Patient Name: [name] WUJWJ'XToday's Date: 08/13/2015 Reason for consult: Follow-up assessment;NICU baby;Infant < 6lbs   Follow up with mom of NICU baby now 348 days old. MOm reports pumping is going well and she is receiving about 60 cc per pumping. Mom reports her nipple issues have resolved. Enc mom to call with questions/concerns prn   Maternal Data    Feeding Feeding Type: Breast Milk Length of feed: 30 min  LATCH Score/Interventions                      Lactation Tools Discussed/Used     Consult Status Consult Status: PRN Follow-up type: Call as needed    Ed BlalockSharon S Babita Amaker 08/13/2015, 2:25 PM

## 2015-08-15 ENCOUNTER — Ambulatory Visit: Payer: Self-pay

## 2015-08-15 NOTE — Lactation Note (Signed)
This note was copied from the chart of Anne Reece Packerlicia Pytel. Lactation Consultation Note  Patient Name: Anne Mcdowell ZOXWR'UToday's Date: 08/15/2015 Reason for consult: Follow-up assessment;NICU baby  NICU baby 8310 days old. Called to NICU to assist mom with breast engorgement. Mom's right breast more engorged than left, and mom states she is not able to pump much. Assisted mom to apply ice to breast, and then use hand pump to get EBM moving from both breasts. Attempted to use DEBP, but milk did not flow. Plan is for mom to continue using ice, hand and manual pump expression, and then as breasts soften, return to using DEBP. Mom reports that she has been pump 4-5 times/24 hours. Enc mom to pump 8 times/24 hours followed by hand expression. Discussed with mom that it is important to keep milk flowing, and that having milk sit in breast can cause her breasts to shut down and stop producing milk. Mom states that she understands, and has understood all along, but that it is difficult to pump so often. Mom states that she intends to try to pump the 8 times, because she is really uncomfortable now and does want to keep providing EBM. Maternal Data    Feeding Feeding Type: Breast Milk Length of feed: 45 min  LATCH Score/Interventions                      Lactation Tools Discussed/Used Tools: Pump Breast pump type: Manual   Consult Status Consult Status: PRN    Geralynn OchsWILLIARD, Caiden Arteaga 08/15/2015, 3:38 PM

## 2015-09-03 ENCOUNTER — Ambulatory Visit: Payer: Self-pay

## 2015-09-03 NOTE — Lactation Note (Signed)
This note was copied from the chart of Anne Reece Packerlicia Horacek. Lactation Consultation Note  Patient Name: Anne Mcdowell ZOXWR'UToday's Date: 09/03/2015 Reason for consult: Follow-up assessment;NICU baby NICU baby 484 weeks old. Mom reports that she is pumping over 2 ounces each time that she pumps, and everything is going well. Mom states that she was talking with PT today about when to start attempting to nurse. Mom is hoping to attempt to BF next week. Enc mom to call for assistance as needed.   Maternal Data    Feeding Feeding Type: Breast Milk Length of feed: 30 min  LATCH Score/Interventions                      Lactation Tools Discussed/Used     Consult Status Consult Status: PRN    Geralynn OchsWILLIARD, Waldine Zenz 09/03/2015, 2:31 PM

## 2015-09-22 ENCOUNTER — Ambulatory Visit: Payer: Self-pay

## 2015-09-22 NOTE — Lactation Note (Signed)
This note was copied from the chart of Anne Reece Packerlicia Drennen. Lactation Consultation Note  Visit made with mom in NICU.  Mom states she has a good supply.  Baby hasn't attempted breast in over a week.  Encouraged mom to make attempts and [facility].  Discussed that breastfeeding should improve and become more effective with practice and as baby reaches term.  Patient Name: Anne Mcdowell ZOXWR'UToday's Date: 09/22/2015     Maternal Data    Feeding Feeding Type: Breast Milk Length of feed: 30 min  LATCH Score/Interventions                      Lactation Tools Discussed/Used     Consult Status      Huston FoleyMOULDEN, Patryk Conant S 09/22/2015, 11:42 AM

## 2015-09-23 ENCOUNTER — Ambulatory Visit: Payer: Self-pay

## 2015-09-23 NOTE — Lactation Note (Signed)
This note was copied from the chart of Anne Victoire Hittle. Lactation ConsuReece Packerltation Note  Mom already had baby latched when I came for assist.  Baby was in football hold and only latched to nipple.  Mom has large nipples and baby has small mouth.  Baby taken off breast after suction broke.  Nipple pinched with a positional stripe.  Mom states nipples have been sore with pumping/  30 mm flanges and comfort gels given to try.  Baby would not relatch.  20 mm nipple shield applied and baby was able to get deeper latch.  Shield stimulated active suckling.  Instructed mom to attempt putting baby to breast 2-3 times per day.  Encouraged to ask for help when needed.  Patient Name: Anne Mcdowell GNFAO'ZToday's Date: 09/23/2015 Reason for consult: Follow-up assessment   Maternal Data    Feeding Feeding Type: Breast Fed Nipple Type: Slow - flow Length of feed: 30 min  LATCH Score/Interventions Latch: Grasps breast easily, tongue down, lips flanged, rhythmical sucking. (with 20 mm nipple shield)  Audible Swallowing: A few with stimulation  Type of Nipple: Everted at rest and after stimulation  Comfort (Breast/Nipple): Soft / non-tender     Hold (Positioning): Assistance needed to correctly position infant at breast and maintain latch. Intervention(s): Breastfeeding basics reviewed;Support Pillows  LATCH Score: 8  Lactation Tools Discussed/Used Tools: Nipple Shields Nipple shield size: 20   Consult Status Consult Status: PRN    Huston FoleyMOULDEN, Laiya Wisby S 09/23/2015, 5:30 PM

## 2015-09-24 ENCOUNTER — Ambulatory Visit (INDEPENDENT_AMBULATORY_CARE_PROVIDER_SITE_OTHER): Payer: Medicaid Other | Admitting: Advanced Practice Midwife

## 2015-09-24 ENCOUNTER — Encounter: Payer: Self-pay | Admitting: Advanced Practice Midwife

## 2015-09-24 DIAGNOSIS — Z30430 Encounter for insertion of intrauterine contraceptive device: Secondary | ICD-10-CM | POA: Diagnosis not present

## 2015-09-24 DIAGNOSIS — G8918 Other acute postprocedural pain: Secondary | ICD-10-CM

## 2015-09-24 LAB — POCT PREGNANCY, URINE: Preg Test, Ur: NEGATIVE

## 2015-09-24 MED ORDER — MISOPROSTOL 200 MCG PO TABS: ORAL_TABLET | ORAL | Status: DC

## 2015-09-24 MED ORDER — IBUPROFEN 600 MG PO TABS: 600.0000 mg | ORAL_TABLET | Freq: Four times a day (QID) | ORAL | Status: DC | PRN

## 2015-09-24 MED ORDER — IBUPROFEN 200 MG PO TABS
600.0000 mg | ORAL_TABLET | Freq: Once | ORAL | Status: AC
  Administered 2015-09-24: 600 mg via ORAL

## 2015-09-24 NOTE — Progress Notes (Signed)
Patient identified, informed consent performed, signed copy in chart, time out was performed.  Urine pregnancy test negative.  Bimanual exam performed. Cervix anterior. Uterus difficult to palpate to to body habitus, but possibly midline. Speculum placed in the vagina.  Cervix visualized.  Cleaned with Betadine x 2.  Grasped anteriourly with a single tooth tenaculum.  Attempted to sound uterus unsuccessfully x 2. Pt unable to tolerate additional attempt. Feeling dizzy and cramping. Feeling better after lying down x 5 minutes and Ibuprofen dose.   Will return for second attempt w/ Cytotec the night before and Ibuprofen 1 hour before insertion.   NeotsuVirginia Macauley [place], [place] 09/24/2015 5:09 PM

## 2015-09-24 NOTE — Progress Notes (Signed)
Patient ID: Anne Mcdowell, female   DOB: 06/08/1986, 30 y.o.   MRN: [id number] Subjective:     Anne Ballslicia C Hou is a 30 y.o. female who presents for a postpartum visit. She is 7 weeks postpartum following a spontaneous vaginal delivery. I have fully reviewed the prenatal and intrapartum course. The delivery was at 29 gestational weeks. Outcome: spontaneous vaginal delivery. Anesthesia: epidural. Postpartum course has been uncomplicated. Baby's course has been complicated by prematurity. Baby is feeding by both breast and bottle -  . Bleeding no bleeding. Bowel function is normal. Bladder function is normal. Patient is not sexually active. Contraception method is IUD. Postpartum depression screening: negative.  The following portions of the patient's history were reviewed and updated as appropriate: allergies, current medications, past family history, past medical history, past social history, past surgical history and problem list.  Review of Systems Pertinent items are noted in HPI.   Objective:    BP 120/76 mmHg  Pulse 62  Temp(Src) 98 F (36.7 C)  Wt 155 lb 6.4 oz (70.489 kg)  Breastfeeding? Yes  General:  alert, appears stated age and no distress   Breasts:  Declined  Lungs: clear to auscultation bilaterally  Heart:  regular rate and rhythm, S1, S2 normal, no murmur, click, rub or gallop  Abdomen: soft, non-tender; bowel sounds normal; no masses,  no organomegaly   Vulva:  normal  Vagina: normal vagina, no discharge, exudate, lesion, or erythema  Cervix:  multiparous appearance and no cervical motion tenderness  Corpus: Not enlarged. Exam limited by body habitus. ? retroverted   Adnexa:  no mass, fullness, tenderness  Rectal Exam: Not performed.        Assessment:     Normal postpartum exam. Pap smear not done at today's visit.  Attempted IUD insertion w/out success. See procedure note.  1. Pain following surgery or procedure  - ibuprofen (ADVIL,MOTRIN) tablet 600 mg; Take 3  tablets (600 mg total) by mouth once. - misoprostol (CYTOTEC) 200 MCG tablet; Place four tablets in between your gums and cheeks (two tablets on each side) as instructed the night before you IUD insertion.  Dispense: 4 tablet; Refill: 1 - ibuprofen (ADVIL,MOTRIN) 600 MG tablet; Take 1 tablet (600 mg total) by mouth every 6 (six) hours as needed for cramping.  Dispense: 30 tablet; Refill: 1  2. Postpartum exam   3. Encounter for IUD insertion  Plan:    1. Contraception: Return for IUD placement 2. Cytotec night before and Ibuprofen 1 hour before IUD placement  3. Follow up in: ASAP or as needed.

## 2015-10-07 ENCOUNTER — Ambulatory Visit: Payer: Medicaid Other | Admitting: Advanced Practice Midwife

## 2015-10-08 ENCOUNTER — Telehealth: Payer: Self-pay

## 2015-10-08 NOTE — Telephone Encounter (Signed)
Pt stated told her it was ok for her to call back when she was ready to start birth control pills. I advised her I would need to check on this and call her back tomorrow regarding this. According to the chart there were problems trying to place her IUD. Please advise!

## 2015-10-22 ENCOUNTER — Telehealth: Payer: Self-pay | Admitting: Advanced Practice Midwife

## 2015-10-22 NOTE — Telephone Encounter (Signed)
Responding to call asking for OCPs until IUD placement, nut does not want them now since IUD appt is scheduled for 10/30/15. Instructed to abstain until then and use condoms if IC occurs. Pt verbalizes understanding.

## 2015-10-30 ENCOUNTER — Ambulatory Visit (INDEPENDENT_AMBULATORY_CARE_PROVIDER_SITE_OTHER): Payer: Medicaid Other | Admitting: Obstetrics & Gynecology

## 2015-10-30 ENCOUNTER — Encounter: Payer: Self-pay | Admitting: Obstetrics & Gynecology

## 2015-10-30 VITALS — BP 125/77 | HR 65 | Temp 97.6°F | Wt 156.0 lb

## 2015-10-30 DIAGNOSIS — Z3202 Encounter for pregnancy test, result negative: Secondary | ICD-10-CM

## 2015-10-30 DIAGNOSIS — Z3009 Encounter for other general counseling and advice on contraception: Secondary | ICD-10-CM

## 2015-10-30 LAB — POCT PREGNANCY, URINE: Preg Test, Ur: NEGATIVE

## 2015-10-30 NOTE — Progress Notes (Signed)
Patient ID: Anne Mcdowell, female   DOB: 12/07/1985, 30 y.o.   MRN: [id number] History:  30 y.o. [id number] here today for LMP 3 weeks prev.  Pt forgot to take her cytotec and Motrin.  Does not want to attempt the IUD without it.    The following portions of the patient's history were reviewed and updated as appropriate: allergies, current medications, past family history, past medical history, past social history, past surgical history and problem list.  Review of Systems:  Pertinent items are noted in HPI.  Objective:  Physical Exam Blood pressure 125/77, pulse 65, temperature 97.6 F (36.4 C), weight 156 lb (70.761 kg), currently breastfeeding. Gen: NAD Exam deferred  Labs and Imaging No results found.  Assessment & Plan:  Contraception counseling.  Pt wants to come back in after taking cytotec for her IUD.  She wants something in the meantime.   Rec that pt begins the OCPs AFTER her next menses begin.   Sprintec 1 po q day F/u 6month  Kanye Depree L. Harraway-Smith, M.D., [place]

## 2015-10-30 NOTE — Patient Instructions (Signed)
Contraception Choices Contraception (birth control) is the use of any methods or devices to prevent pregnancy. Below are some methods to help avoid pregnancy. HORMONAL METHODS   Contraceptive implant. This is a thin, plastic tube containing progesterone hormone. It does not contain estrogen hormone. Your health care provider inserts the tube in the inner part of the upper arm. The tube can remain in place for up to 3 years. After 3 years, the implant must be removed. The implant prevents the ovaries from releasing an egg (ovulation), thickens the cervical mucus to prevent sperm from entering the uterus, and thins the lining of the inside of the uterus.  Progesterone-only injections. These injections are given every 3 months by your health care provider to prevent pregnancy. This synthetic progesterone hormone stops the ovaries from releasing eggs. It also thickens cervical mucus and changes the uterine lining. This makes it harder for sperm to survive in the uterus.  Birth control pills. These pills contain estrogen and progesterone hormone. They work by preventing the ovaries from releasing eggs (ovulation). They also cause the cervical mucus to thicken, preventing the sperm from entering the uterus. Birth control pills are prescribed by a health care provider.Birth control pills can also be used to treat heavy periods.  Minipill. This type of birth control pill contains only the progesterone hormone. They are taken every day of each month and must be prescribed by your health care provider.  Birth control patch. The patch contains hormones similar to those in birth control pills. It must be changed once a week and is prescribed by a health care provider.  Vaginal ring. The ring contains hormones similar to those in birth control pills. It is left in the vagina for 3 weeks, removed for 1 week, and then a new one is put back in place. The patient must be comfortable inserting and removing the ring  from the vagina.A health care provider's prescription is necessary.  Emergency contraception. Emergency contraceptives prevent pregnancy after unprotected sexual intercourse. This pill can be taken right after sex or up to 5 days after unprotected sex. It is most effective the sooner you take the pills after having sexual intercourse. Most emergency contraceptive pills are available without a prescription. Check with your pharmacist. Do not use emergency contraception as your only form of birth control. BARRIER METHODS   Female condom. This is a thin sheath (latex or rubber) that is worn over the penis during sexual intercourse. It can be used with spermicide to increase effectiveness.  Female condom. This is a soft, loose-fitting sheath that is put into the vagina before sexual intercourse.  Diaphragm. This is a soft, latex, dome-shaped barrier that must be fitted by a health care provider. It is inserted into the vagina, along with a spermicidal jelly. It is inserted before intercourse. The diaphragm should be left in the vagina for 6 to 8 hours after intercourse.  Cervical cap. This is a round, soft, latex or plastic cup that fits over the cervix and must be fitted by a health care provider. The cap can be left in place for up to 48 hours after intercourse.  Sponge. This is a soft, circular piece of polyurethane foam. The sponge has spermicide in it. It is inserted into the vagina after wetting it and before sexual intercourse.  Spermicides. These are chemicals that kill or block sperm from entering the cervix and uterus. They come in the form of creams, jellies, suppositories, foam, or tablets. They do not require a   prescription. They are inserted into the vagina with an applicator before having sexual intercourse. The process must be repeated every time you have sexual intercourse. INTRAUTERINE CONTRACEPTION  Intrauterine device (IUD). This is a T-shaped device that is put in a woman's uterus  during a menstrual period to prevent pregnancy. There are 2 types:  Copper IUD. This type of IUD is wrapped in copper wire and is placed inside the uterus. Copper makes the uterus and fallopian tubes produce a fluid that kills sperm. It can stay in place for 10 years.  Hormone IUD. This type of IUD contains the hormone progestin (synthetic progesterone). The hormone thickens the cervical mucus and prevents sperm from entering the uterus, and it also thins the uterine lining to prevent implantation of a fertilized egg. The hormone can weaken or kill the sperm that get into the uterus. It can stay in place for 3-5 years, depending on which type of IUD is used. PERMANENT METHODS OF CONTRACEPTION  Female tubal ligation. This is when the woman's fallopian tubes are surgically sealed, tied, or blocked to prevent the egg from traveling to the uterus.  Hysteroscopic sterilization. This involves placing a small coil or insert into each fallopian tube. Your doctor uses a technique called hysteroscopy to do the procedure. The device causes scar tissue to form. This results in permanent blockage of the fallopian tubes, so the sperm cannot fertilize the egg. It takes about 3 months after the procedure for the tubes to become blocked. You must use another form of birth control for these 3 months.  Female sterilization. This is when the female has the tubes that carry sperm tied off (vasectomy).This blocks sperm from entering the vagina during sexual intercourse. After the procedure, the man can still ejaculate fluid (semen). NATURAL PLANNING METHODS  Natural family planning. This is not having sexual intercourse or using a barrier method (condom, diaphragm, cervical cap) on days the woman could become pregnant.  Calendar method. This is keeping track of the length of each menstrual cycle and identifying when you are fertile.  Ovulation method. This is avoiding sexual intercourse during ovulation.  Symptothermal  method. This is avoiding sexual intercourse during ovulation, using a thermometer and ovulation symptoms.  Post-ovulation method. This is timing sexual intercourse after you have ovulated. Regardless of which type or method of contraception you choose, it is important that you use condoms to protect against the transmission of sexually transmitted infections (STIs). Talk with your health care provider about which form of contraception is most appropriate for you.   This information is not intended to replace advice given to you by your health care provider. Make sure you discuss any questions you have with your health care provider.   Document Released: 09/06/2005 Document Revised: 09/11/2013 Document Reviewed: 03/01/2013 Elsevier Interactive Patient Education 2016 Elsevier Inc.  

## 2015-11-27 ENCOUNTER — Encounter: Payer: Self-pay | Admitting: Obstetrics & Gynecology

## 2015-11-27 ENCOUNTER — Ambulatory Visit (INDEPENDENT_AMBULATORY_CARE_PROVIDER_SITE_OTHER): Payer: Medicaid Other | Admitting: Obstetrics & Gynecology

## 2015-11-27 ENCOUNTER — Other Ambulatory Visit: Payer: Self-pay | Admitting: General Practice

## 2015-11-27 VITALS — BP 111/79 | HR 74 | Wt 160.3 lb

## 2015-11-27 DIAGNOSIS — Z3202 Encounter for pregnancy test, result negative: Secondary | ICD-10-CM | POA: Diagnosis not present

## 2015-11-27 DIAGNOSIS — Z30430 Encounter for insertion of intrauterine contraceptive device: Secondary | ICD-10-CM | POA: Diagnosis present

## 2015-11-27 DIAGNOSIS — Z975 Presence of (intrauterine) contraceptive device: Secondary | ICD-10-CM

## 2015-11-27 LAB — POCT PREGNANCY, URINE: Preg Test, Ur: NEGATIVE

## 2015-11-27 MED ORDER — LEVONORGESTREL 18.6 MCG/DAY IU IUD
INTRAUTERINE_SYSTEM | Freq: Once | INTRAUTERINE | Status: AC
  Administered 2015-11-27: 1 via INTRAUTERINE

## 2015-11-27 NOTE — Progress Notes (Signed)
Patient ID: Almedia Ballslicia C Elenes, female   DOB: 12/18/1985, 30 y.o.   MRN: [id number] GYNECOLOGY CLINIC PROCEDURE NOTE  Almedia Ballslicia C Baldo is a 30 y.o. [phone] here for Mirena IUD insertion. No GYN concerns.    IUD Insertion Procedure Note Patient identified, informed consent performed.  Discussed risks of irregular bleeding, cramping, infection, malpositioning or misplacement of the IUD outside the uterus which may require further procedures. Time out was performed.  Urine pregnancy test negative.  Speculum placed in the vagina.  Cervix visualized.  Cleaned with Betadine x 2.  Grasped anteriorly with a single tooth tenaculum.  Uterus sounded to 7 cm.  Mirena IUD placed per manufacturer's recommendations.  Strings trimmed to 3 cm. Tenaculum was removed, good hemostasis noted.  Patient tolerated procedure well.   Patient was given post-procedure instructions.  Patient was asked to follow up in 4 weeks for IUD check.  Major Santerre L. Harraway-Smith, M.D., Evern CoreFACOG

## 2015-11-27 NOTE — Patient Instructions (Signed)
Levonorgestrel intrauterine device (IUD) What is this medicine? LEVONORGESTREL IUD (LEE voe nor jes trel) is a contraceptive (birth control) device. The device is placed inside the uterus by a healthcare professional. It is used to prevent pregnancy and can also be used to treat heavy bleeding that occurs during your period. Depending on the device, it can be used for 3 to 5 years. This medicine may be used for other purposes; ask your health care provider or pharmacist if you have questions. What should I tell my health care provider before I take this medicine? They need to know if you have any of these conditions: -abnormal Pap smear -cancer of the breast, uterus, or cervix -diabetes -endometritis -genital or pelvic infection now or in the past -have more than one sexual partner or your partner has more than one partner -heart disease -history of an ectopic or tubal pregnancy -immune system problems -IUD in place -liver disease or tumor -problems with blood clots or take blood-thinners -use intravenous drugs -uterus of unusual shape -vaginal bleeding that has not been explained -an unusual or allergic reaction to levonorgestrel, other hormones, silicone, or polyethylene, medicines, foods, dyes, or preservatives -pregnant or trying to get pregnant -breast-feeding How should I use this medicine? This device is placed inside the uterus by a health care professional. Talk to your pediatrician regarding the use of this medicine in children. Special care may be needed. Overdosage: If you think you have taken too much of this medicine contact a poison control center or emergency room at once. NOTE: This medicine is only for you. Do not share this medicine with others. What if I miss a dose? This does not apply. What may interact with this medicine? Do not take this medicine with any of the following medications: -amprenavir -bosentan -fosamprenavir This medicine may also interact with  the following medications: -aprepitant -barbiturate medicines for inducing sleep or treating seizures -bexarotene -griseofulvin -medicines to treat seizures like carbamazepine, ethotoin, felbamate, oxcarbazepine, phenytoin, topiramate -modafinil -pioglitazone -rifabutin -rifampin -rifapentine -some medicines to treat HIV infection like atazanavir, indinavir, lopinavir, nelfinavir, tipranavir, ritonavir -St. John's wort -warfarin This list may not describe all possible interactions. Give your health care provider a list of all the medicines, herbs, non-prescription drugs, or dietary supplements you use. Also tell them if you smoke, drink alcohol, or use illegal drugs. Some items may interact with your medicine. What should I watch for while using this medicine? Visit your doctor or health care professional for regular check ups. See your doctor if you or your partner has sexual contact with others, becomes HIV positive, or gets a sexual transmitted disease. This product does not protect you against HIV infection (AIDS) or other sexually transmitted diseases. You can check the placement of the IUD yourself by reaching up to the top of your vagina with clean fingers to feel the threads. Do not pull on the threads. It is a good habit to check placement after each menstrual period. Call your doctor right away if you feel more of the IUD than just the threads or if you cannot feel the threads at all. The IUD may come out by itself. You may become pregnant if the device comes out. If you notice that the IUD has come out use a backup birth control method like condoms and call your health care provider. Using tampons will not change the position of the IUD and are okay to use during your period. What side effects may I notice from receiving this medicine?   Side effects that you should report to your doctor or health care professional as soon as possible: -allergic reactions like skin rash, itching or  hives, swelling of the face, lips, or tongue -fever, flu-like symptoms -genital sores -high blood pressure -no menstrual period for 6 weeks during use -pain, swelling, warmth in the leg -pelvic pain or tenderness -severe or sudden headache -signs of pregnancy -stomach cramping -sudden shortness of breath -trouble with balance, talking, or walking -unusual vaginal bleeding, discharge -yellowing of the eyes or skin Side effects that usually do not require medical attention (report to your doctor or health care professional if they continue or are bothersome): -acne -breast pain -change in sex drive or performance -changes in weight -cramping, dizziness, or faintness while the device is being inserted -headache -irregular menstrual bleeding within first 3 to 6 months of use -nausea This list may not describe all possible side effects. Call your doctor for medical advice about side effects. You may report side effects to FDA at 1-800-FDA-1088. Where should I keep my medicine? This does not apply. NOTE: This sheet is a summary. It may not cover all possible information. If you have questions about this medicine, talk to your doctor, pharmacist, or health care provider.    2016, Elsevier/Gold Standard. (2011-10-07 13:54:04)  

## 2015-12-29 ENCOUNTER — Ambulatory Visit: Payer: Medicaid Other | Admitting: Obstetrics & Gynecology

## 2016-02-26 ENCOUNTER — Encounter: Payer: Self-pay | Admitting: Obstetrics and Gynecology

## 2016-02-26 ENCOUNTER — Ambulatory Visit (INDEPENDENT_AMBULATORY_CARE_PROVIDER_SITE_OTHER): Payer: Medicaid Other | Admitting: Obstetrics and Gynecology

## 2016-02-26 VITALS — BP 110/80 | HR 77 | Ht 60.0 in | Wt 158.0 lb

## 2016-02-26 DIAGNOSIS — N939 Abnormal uterine and vaginal bleeding, unspecified: Secondary | ICD-10-CM | POA: Insufficient documentation

## 2016-02-26 DIAGNOSIS — N921 Excessive and frequent menstruation with irregular cycle: Secondary | ICD-10-CM

## 2016-02-26 DIAGNOSIS — Z30431 Encounter for routine checking of intrauterine contraceptive device: Secondary | ICD-10-CM

## 2016-02-26 DIAGNOSIS — Z975 Presence of (intrauterine) contraceptive device: Principal | ICD-10-CM

## 2016-02-26 MED ORDER — NORGESTIMATE-ETH ESTRADIOL 0.25-35 MG-MCG PO TABS: 1.0000 | ORAL_TABLET | Freq: Every day | ORAL | Status: DC

## 2016-02-26 NOTE — Progress Notes (Signed)
GYN Clinic  CC: AUB with Mirena in place  HPI:  [age] y/o [id number] (LMP unknown) with the above CC. PMhx significant for h/o lupus.  Patient had PP Mirena placed 3/9 for contraception and since then she had AUB since then. Infant was in the NICU so wasn't breast feeding/pumping at time of placement. She thinks she might have periods since it was put in but not sure. She goes through a few non saturated panty liners qday that can be bright or dark. The VB is non painful and it's stable and not worsening or increasing.  +h/o OCPs in the past with no problems and no h/o VTE or migraines  No nausea, vomiting, chest pain, SOB, fevers, chills, abdominal pain, vaginal discharge, dysuria  Review of Systems Pertinent items are noted in HPI.    Objective:    BP 110/80 mmHg  Pulse 77  Ht 5' (1.524 m)  Wt 158 lb (71.668 kg)  BMI 30.86 kg/m2  LMP  (LMP Unknown)  Breastfeeding? No NAD Abd: soft, NTTP, ND GU: EGBUS normal Vaginal vault with minimal amount old blood, cx closed with IUD strings 3-4cm and tucked into fornices, nttp  Assessment:  AUB with IUD in place. Pt stable  Plan:   D/w pt that very common for IUD to cause AUB but should be stopping/stopped by about 6048m s/p insertion. I d/w her that I recommend doing a month of OCPs and having a regular period from it and hopefully that'll help with her AUB but also reset her bleeding/cycles. Will tenatively scheduled 8wk TVUS and MD visit but pt told that if OCPs stop the bleeding and it is stopped then she can cancel visit. Pt amenable to plan  Cornelia Copaharlie Halston Kintz, Jr MD Attending Center for [facility](Faculty Practice)

## 2016-04-16 ENCOUNTER — Telehealth: Payer: Self-pay | Admitting: *Deleted

## 2016-04-16 NOTE — Telephone Encounter (Signed)
Received message on nurse voicemail left on 04/16/16 at 1101.  Melanie from [facility] calling.  Patient has U/S scheduled for 04/21/16.  Needs pre-auth through [employer].  Requests return call to [phone].

## 2016-04-21 ENCOUNTER — Encounter (HOSPITAL_COMMUNITY): Payer: Self-pay

## 2016-04-21 ENCOUNTER — Ambulatory Visit (HOSPITAL_COMMUNITY): Admission: RE | Admit: 2016-04-21 | Payer: Medicaid Other | Source: Ambulatory Visit

## 2016-04-22 NOTE — Telephone Encounter (Signed)
Pt did not need ultrasound, no need to obtain pre auth.

## 2016-05-31 ENCOUNTER — Encounter: Payer: Self-pay | Admitting: Obstetrics & Gynecology

## 2016-05-31 ENCOUNTER — Other Ambulatory Visit (HOSPITAL_COMMUNITY)
Admission: RE | Admit: 2016-05-31 | Discharge: 2016-05-31 | Disposition: A | Discharge status: Home / Self Care | Payer: Medicaid Other | Source: Ambulatory Visit | Attending: Obstetrics & Gynecology | Admitting: Obstetrics & Gynecology

## 2016-05-31 ENCOUNTER — Ambulatory Visit (INDEPENDENT_AMBULATORY_CARE_PROVIDER_SITE_OTHER): Payer: Medicaid Other | Admitting: Obstetrics & Gynecology

## 2016-05-31 VITALS — BP 119/68 | HR 72 | Wt 166.4 lb

## 2016-05-31 DIAGNOSIS — R87810 Cervical high risk human papillomavirus (HPV) DNA test positive: Secondary | ICD-10-CM | POA: Insufficient documentation

## 2016-05-31 DIAGNOSIS — B373 Candidiasis of vulva and vagina: Secondary | ICD-10-CM | POA: Diagnosis not present

## 2016-05-31 DIAGNOSIS — Z1151 Encounter for screening for human papillomavirus (HPV): Secondary | ICD-10-CM | POA: Insufficient documentation

## 2016-05-31 DIAGNOSIS — Z124 Encounter for screening for malignant neoplasm of cervix: Secondary | ICD-10-CM | POA: Diagnosis not present

## 2016-05-31 DIAGNOSIS — Z01419 Encounter for gynecological examination (general) (routine) without abnormal findings: Secondary | ICD-10-CM | POA: Insufficient documentation

## 2016-05-31 DIAGNOSIS — B379 Candidiasis, unspecified: Secondary | ICD-10-CM

## 2016-05-31 MED ORDER — FLUCONAZOLE 150 MG PO TABS: ORAL_TABLET | ORAL | 0 refills | Status: DC

## 2016-05-31 MED ORDER — CITALOPRAM HYDROBROMIDE 20 MG PO TABS: 20.0000 mg | ORAL_TABLET | Freq: Every day | ORAL | 3 refills | Status: DC

## 2016-05-31 NOTE — Progress Notes (Signed)
   Subjective:    Patient ID: Anne Mcdowell, female    DOB: 12/18/1985, [age] y.o.   MRN: [id number]  HPI  Pt c/o vaginal itching and discharge for 2 weeks.  Was on Clinda for dental work.  Has not tried OTC meds.    Review of Systems  Respiratory: Negative.   Cardiovascular: Negative.   Gastrointestinal: Negative.   Genitourinary: Positive for vaginal discharge.       Objective:   Physical Exam  Constitutional: She is oriented to person, place, and time. She appears well-developed and well-nourished. No distress.  HENT:  Head: Normocephalic and atraumatic.  Eyes: Conjunctivae are normal.  Pulmonary/Chest: Effort normal.  Abdominal: Soft. Bowel sounds are normal. She exhibits no distension and no mass. There is no tenderness. There is no rebound and no guarding.  Genitourinary:  Genitourinary Comments: Thick discharge in vagina No lesion Uterus nml size and contour  Musculoskeletal: She exhibits no edema.  Neurological: She is alert and oriented to person, place, and time.  Skin: Skin is warm and dry.  Psychiatric: She has a normal mood and affect.  Vitals reviewed.   Assessment & Plan:   30 yo female with presumed yeast infection 1-Diflucan  2-wet prep 3-pap with cotesting 4- Refill on Celexa--advised to get primary are MD

## 2016-06-01 LAB — WET PREP, GENITAL
CLUE CELLS WET PREP: NONE SEEN
Trich, Wet Prep: NONE SEEN
WBC, Wet Prep HPF POC: NONE SEEN
Yeast Wet Prep HPF POC: NONE SEEN

## 2016-06-01 LAB — CYTOLOGY - PAP

## 2016-06-03 ENCOUNTER — Encounter: Payer: Self-pay | Admitting: Obstetrics & Gynecology

## 2016-06-03 DIAGNOSIS — IMO0002 Reserved for concepts with insufficient information to code with codable children: Secondary | ICD-10-CM | POA: Insufficient documentation

## 2016-06-10 ENCOUNTER — Encounter: Payer: Self-pay | Admitting: General Practice

## 2016-06-10 ENCOUNTER — Telehealth: Payer: Self-pay | Admitting: General Practice

## 2016-06-10 NOTE — Telephone Encounter (Signed)
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 Called patient, no answer- left message to call us back for non urgent results. Will send letter

## 2016-07-03 IMAGING — US US OB TRANSVAGINAL
1 series · 14 of 28 positions shown · non-contrast
Comparison: None.

CLINICAL DATA: Pregnant patient with vaginal bleeding for 2 days.
Cramping.

EXAM:
OBSTETRIC <14 WK US AND TRANSVAGINAL OB US
TECHNIQUE: Both transabdominal and transvaginal ultrasound examinations were
performed for complete evaluation of the gestation as well as the
maternal uterus, adnexal regions, and pelvic cul-de-sac.
Transvaginal technique was performed to assess early pregnancy.

[Series 1: us ob transvaginal · 0.14mm/px · 14 of 42 slices shown]
[im 2/42]
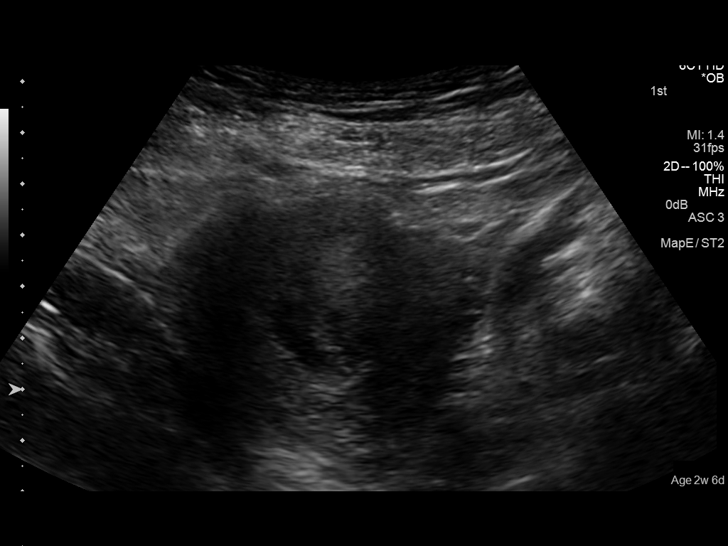
[im 5/42]
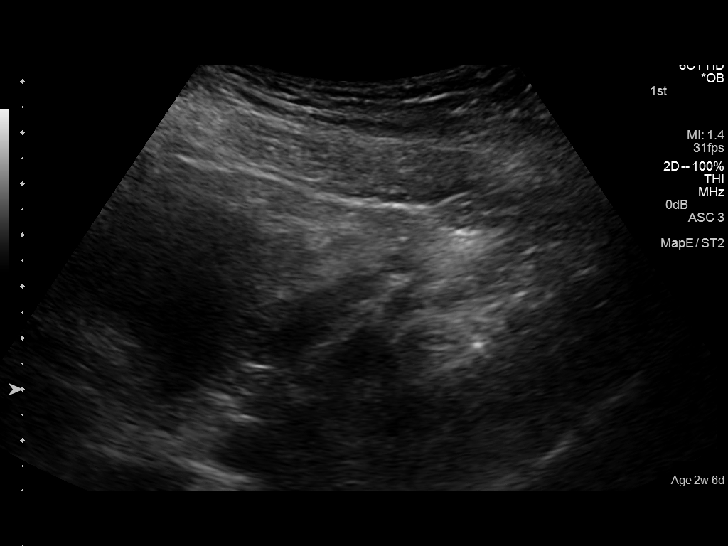
[im 8/42]
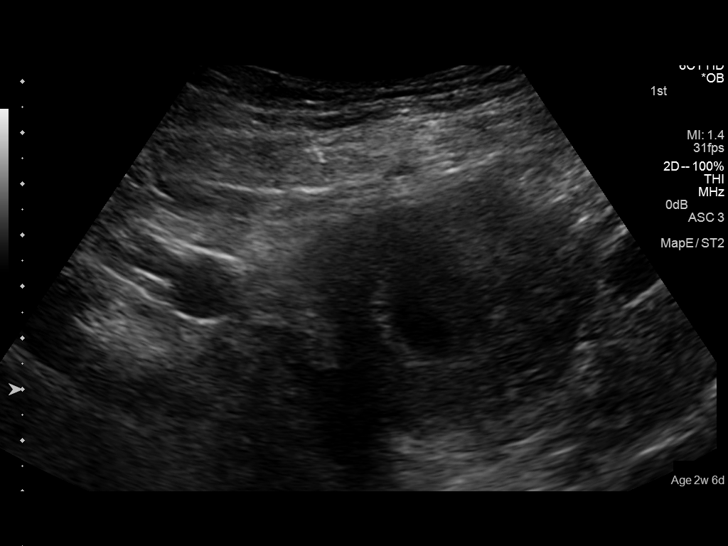
[im 11/42]
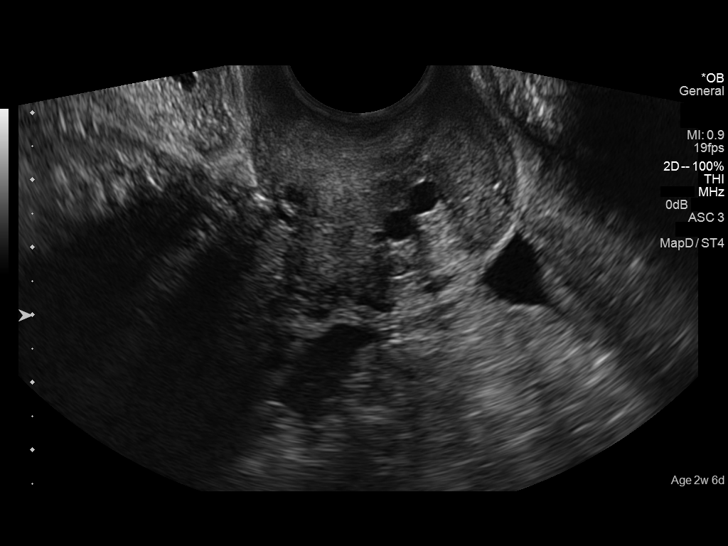
[im 14/42]
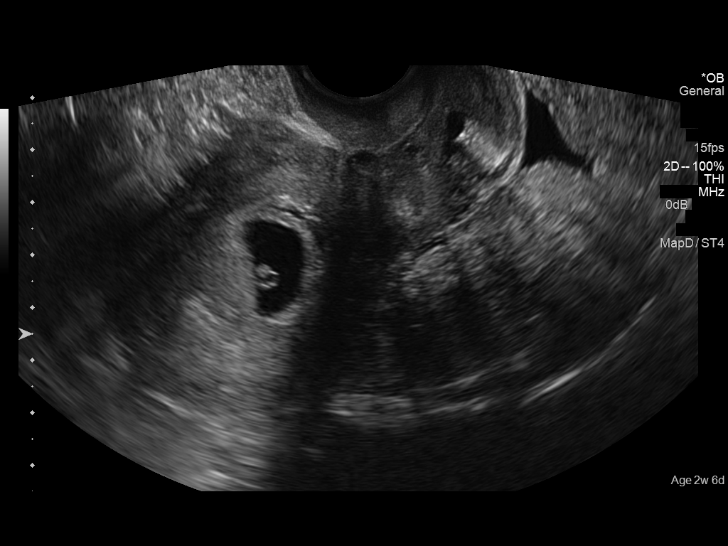
[im 17/42]
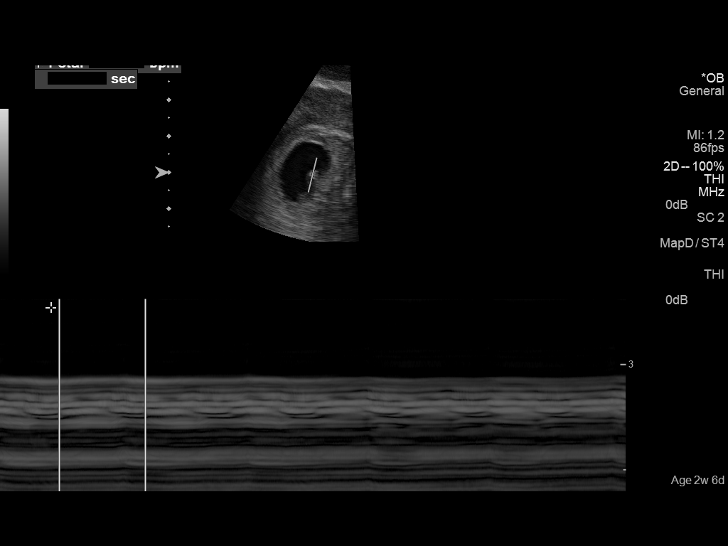
[im 20/42]
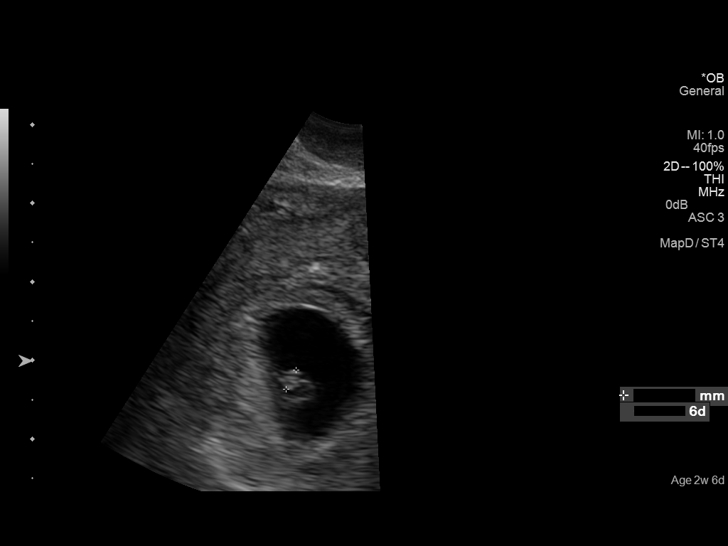
[im 23/42]
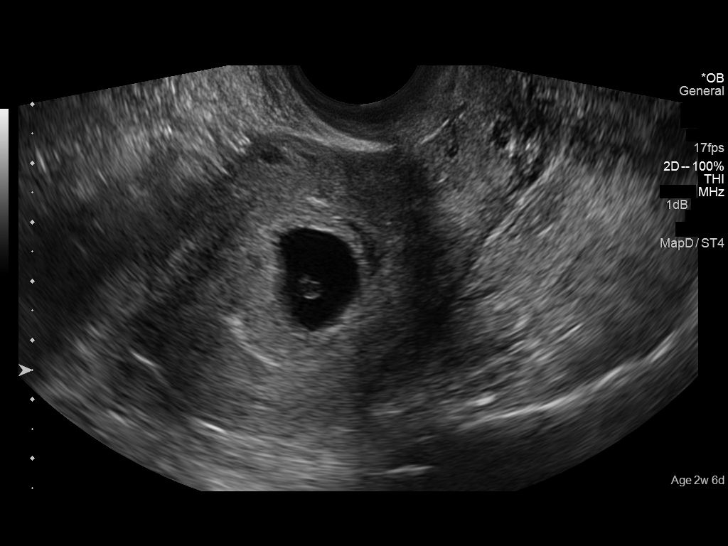
[im 26/42]
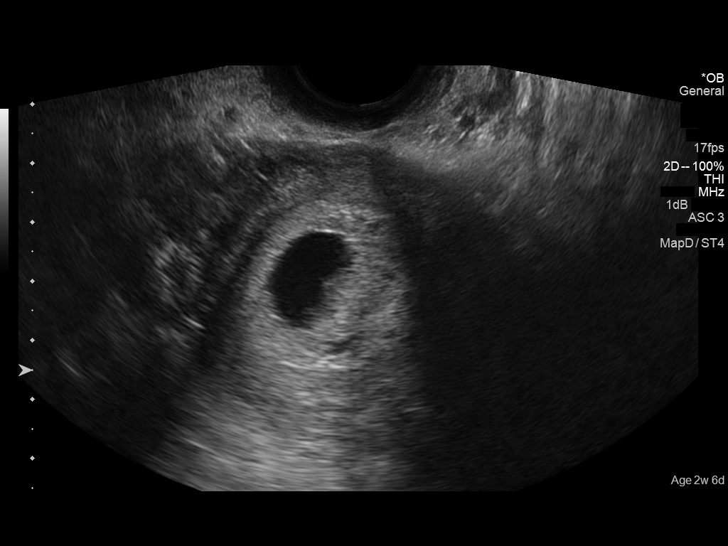
[im 29/42]
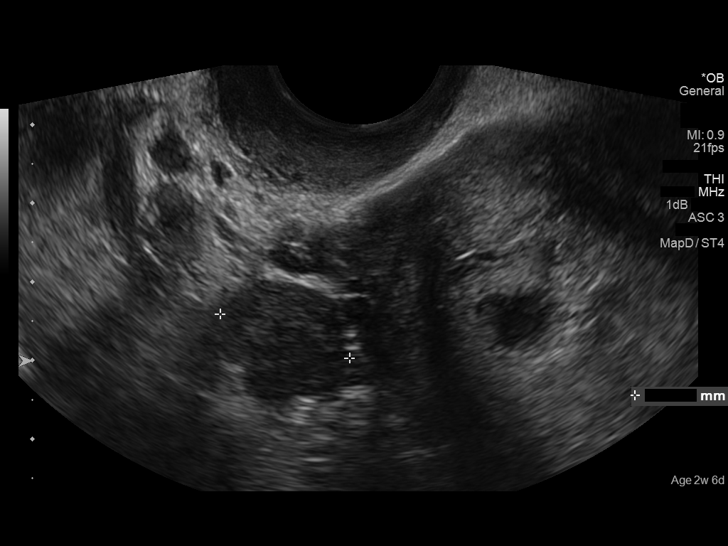
[im 32/42]
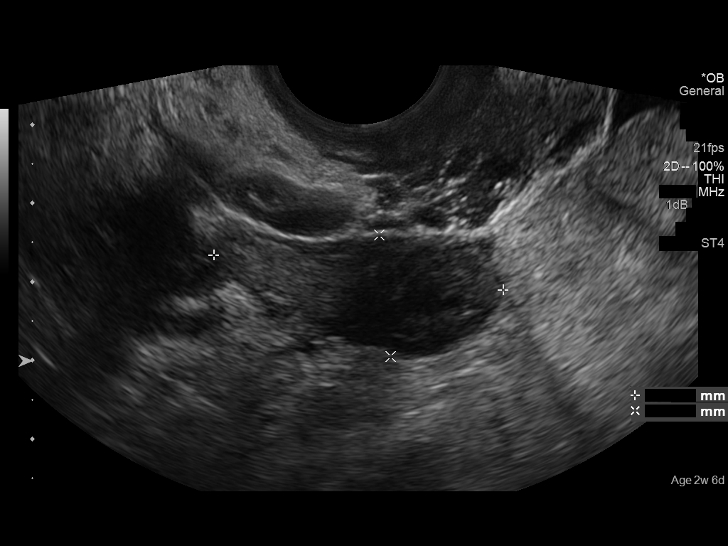
[im 35/42]
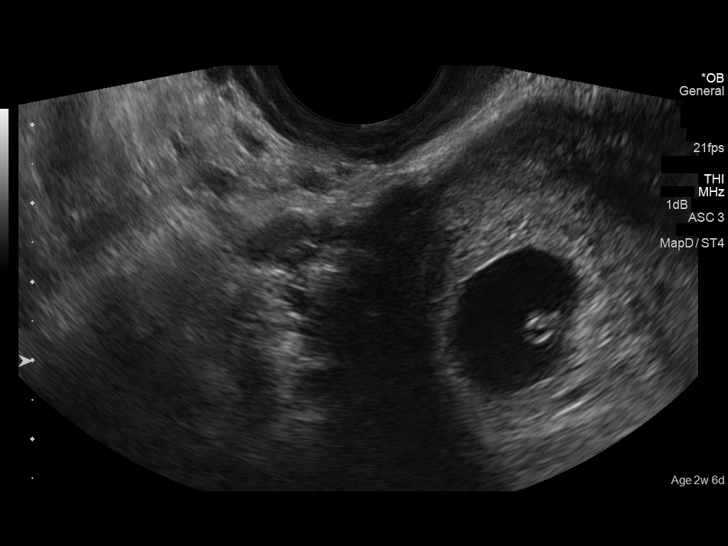
[im 38/42]
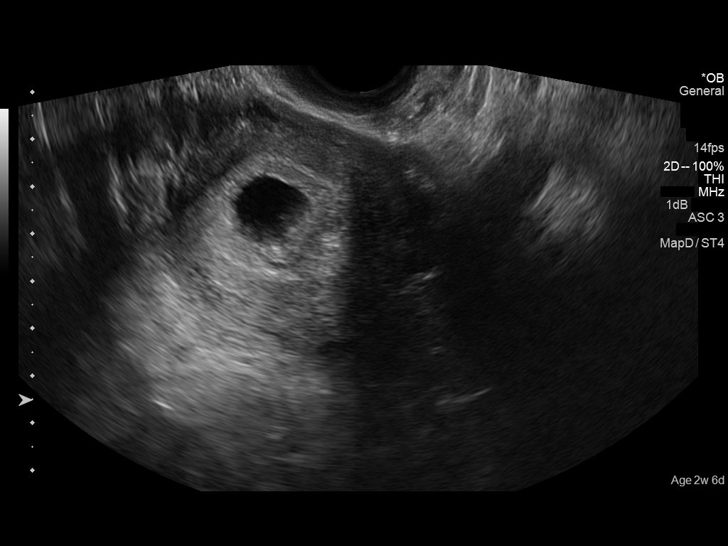
[im 42/42]
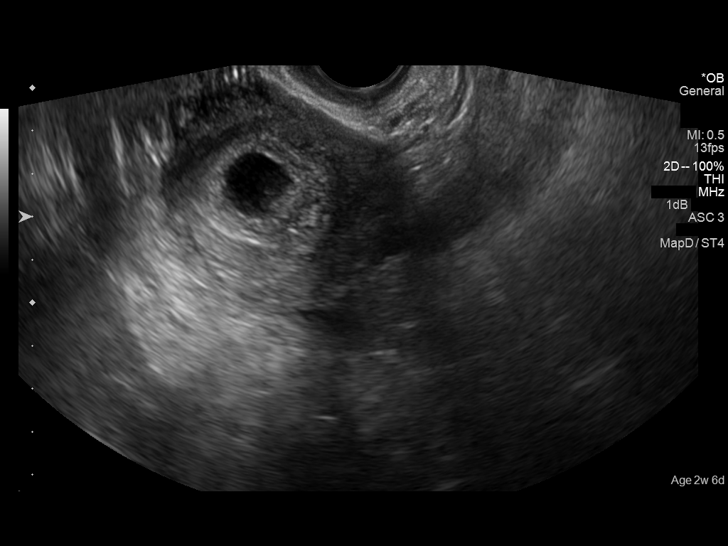

[14 of 28 positions shown; findings below may reference images not displayed]

FINDINGS: Intrauterine gestational sac: Visualized/normal in shape.

Yolk sac:  Present.

Embryo:  Present.

Cardiac Activity: Present.

Heart Rate: 120  bpm

CRL:  2.4  mm   5 w   6 d                  US EDC: 10/20/2015

Maternal uterus/adnexae: No subchorionic hemorrhage. The right ovary
is normal measuring 1.7 x 1.6 x 3.7 cm blood flow. The left ovary is
not seen. Multiple nabothian cysts are in the cervix. Trace free
fluid in the pelvis.
IMPRESSION: Single live intrauterine pregnancy estimated gestational age 5 weeks
6 days for estimated date of delivery 10/20/2015.

## 2016-09-17 ENCOUNTER — Other Ambulatory Visit: Payer: Self-pay | Admitting: Obstetrics & Gynecology

## 2016-09-21 NOTE — Telephone Encounter (Signed)
Pt needs appt with Jamie ([facility])

## 2016-09-23 ENCOUNTER — Telehealth: Payer: Self-pay | Admitting: General Practice

## 2017-01-20 ENCOUNTER — Other Ambulatory Visit: Payer: Self-pay | Admitting: Obstetrics & Gynecology

## 2017-01-21 NOTE — Telephone Encounter (Signed)
Pt needs to obtain primary care.

## 2017-02-02 NOTE — Telephone Encounter (Signed)
Contacted pt and verified if pt has picked up Rx for Celexa.  Pt stated that has picked up medication.  I informed pt that we are referring her to a PCP so that they can manage her on the medication because we would not be able to prescribed future refills.  Gave pt information for [facility] to call today and schedule an appt.   Pt stated understanding.

## 2017-03-29 ENCOUNTER — Other Ambulatory Visit: Payer: Self-pay | Admitting: Obstetrics & Gynecology

## 2017-03-29 NOTE — Telephone Encounter (Signed)
Pt needs primary care MD to manage.

## 2017-04-20 ENCOUNTER — Ambulatory Visit (INDEPENDENT_AMBULATORY_CARE_PROVIDER_SITE_OTHER): Payer: Self-pay | Admitting: Obstetrics and Gynecology

## 2017-04-20 ENCOUNTER — Ambulatory Visit: Payer: Medicaid Other | Admitting: Obstetrics and Gynecology

## 2017-04-20 ENCOUNTER — Encounter: Payer: Self-pay | Admitting: Obstetrics and Gynecology

## 2017-04-20 ENCOUNTER — Other Ambulatory Visit (HOSPITAL_COMMUNITY)
Admission: RE | Admit: 2017-04-20 | Discharge: 2017-04-20 | Disposition: A | Discharge status: Home / Self Care | Payer: Medicaid Other | Source: Ambulatory Visit | Attending: Obstetrics and Gynecology | Admitting: Obstetrics and Gynecology

## 2017-04-20 VITALS — BP 121/77 | HR 71 | Wt 164.9 lb

## 2017-04-20 DIAGNOSIS — Z113 Encounter for screening for infections with a predominantly sexual mode of transmission: Secondary | ICD-10-CM

## 2017-04-20 DIAGNOSIS — R109 Unspecified abdominal pain: Secondary | ICD-10-CM

## 2017-04-20 NOTE — Patient Instructions (Signed)
Will contact you about your results IUD placement is correct   Dysmenorrhea Dysmenorrhea means painful cramps during your period (menstrual period). You will have pain in your lower belly (abdomen). The pain is caused by the tightening (contracting) of the muscles of the womb (uterus). The pain may be mild or very bad. With this condition, you may:  Have a headache.  Feel sick to your stomach (nauseous).  Throw up (vomit).  Have lower back pain.  Follow these instructions at home: Helping pain and cramping  Put heat on your lower back or belly when you have pain or cramps. Use the heat source that your doctor tells you to use. ? Place a towel between your skin and the heat. ? Leave the heat on for 20-30 minutes. ? Remove the heat if your skin turns bright red. This is especially important if you cannot feel pain, heat, or cold. ? Do not have a heating pad on during sleep.  Do aerobic exercises. These include walking, swimming, or biking. These may help with cramps.  Massage your lower back or belly. This may help lessen pain. General instructions  Take over-the-counter and prescription medicines only as told by your doctor.  Do not drive or use heavy machinery while taking prescription pain medicine.  Avoid alcohol and caffeine during and right before your period. These can make cramps worse.  Do not use any products that have nicotine or tobacco. These include cigarettes and e-cigarettes. If you need help quitting, ask your doctor.  Keep all follow-up visits as told by your doctor. This is important. Contact a doctor if:  You have pain that gets worse.  You have pain that does not get better with medicine.  You have pain during sex.  You feel sick to your stomach or you throw up during your period, and medicine does not help. Get help right away if:  You pass out (faint). Summary  Dysmenorrhea means painful cramps during your period (menstrual period).  Put heat  on your lower back or belly when you have pain or cramps.  Do exercises like walking, swimming, or biking to help with cramps.  Contact a doctor if you have pain during sex. This information is not intended to replace advice given to you by your health care provider. Make sure you discuss any questions you have with your health care provider. Document Released: 12/03/2008 Document Revised: 09/23/2016 Document Reviewed: 09/23/2016 Elsevier Interactive Patient Education  2017 [employer]

## 2017-04-20 NOTE — Progress Notes (Signed)
     Subjective: Chief Complaint  Patient presents with  . Gynecologic Exam     HPI: Anne Mcdowell is a 31 y.o. presenting to clinic today to discuss the following:  #STD testing Unprotected intercourse recently Wants testing Last tested last year H/o HPV Denies fevers or discharge  #IUD placement  Cramping recently that is new Sharp lower abdominal pain that comes in goes Still menstruating with IUD IUD present for two years now Wondering about placement    ROS noted in HPI.   Past Medical, Surgical, Social, and Family History Reviewed & Updated per EMR.    History  Smoking Status  . Never Smoker  Smokeless Tobacco  . Never Used    Objective: BP 121/77   Pulse 71   Wt 164 lb 14.4 oz (74.8 kg)   LMP 04/15/2017   Breastfeeding? No   BMI 32.20 kg/m  Vitals and nursing notes reviewed  Physical Exam Gen: NAD, pleasant, cooperative Heart: regular rate Lungs: NWOB Abdomen: soft, nontender to palpation GU: normal appearing external genitalia without lesions. Vagina is moist with bloody discharge. Cervix normal in appearance. IUD strings visulaized. No cervical motion tenderness or tenderness on bimanual exam. No adnexal masses.  Neuro: grossly nonfocal, speech normal  No results found for this or any previous visit (from the past 72 hour(s)).  Assessment/Plan: 1. Abdominal cramping Most likely has some dysmenorrhea with menstruation despite IUD being in place. This would be a new problem for patient so not sure if this is completely the diagnosis. Will get STD testing to rule out infection as cause. IUD was visualized in cervix and do not think it is misplaced. Instructed patient to try NSAIDs to help with pain. Follow-up if persist.   2. Screen for STD (sexually transmitted disease) STD testing collected.  - Cervicovaginal ancillary only - HIV antibody (with reflex) - RPR - Hepatitis C Antibody - Hepatitis B Surface AntiGEN  PATIENT EDUCATION PROVIDED:  See AVS    Caryl AdaJazma Harmonee Tozer, DO OB Fellow Faculty Practice, [facility] 04/20/2017, 8:04 AM

## 2017-04-21 LAB — RPR: RPR: NONREACTIVE

## 2017-04-21 LAB — HEPATITIS B SURFACE ANTIGEN: Hepatitis B Surface Ag: NEGATIVE

## 2017-04-21 LAB — HIV ANTIBODY (ROUTINE TESTING W REFLEX): HIV Screen 4th Generation wRfx: NONREACTIVE

## 2017-04-21 LAB — HEPATITIS C ANTIBODY

## 2017-04-22 LAB — CERVICOVAGINAL ANCILLARY ONLY
Bacterial vaginitis: POSITIVE — AB
CANDIDA VAGINITIS: NEGATIVE
Chlamydia: NEGATIVE
NEISSERIA GONORRHEA: NEGATIVE
TRICH (WINDOWPATH): NEGATIVE

## 2017-04-26 ENCOUNTER — Telehealth: Payer: Self-pay

## 2017-04-26 MED ORDER — METRONIDAZOLE 500 MG PO TABS: 500.0000 mg | ORAL_TABLET | Freq: Two times a day (BID) | ORAL | 0 refills | Status: AC

## 2017-04-26 NOTE — Telephone Encounter (Signed)
Patient lab results showed BV on wet prep. Will send in Flagyl per protcotol to patient pharmacy. All other test are negative at this time.

## 2017-09-12 ENCOUNTER — Other Ambulatory Visit: Payer: Self-pay | Admitting: Obstetrics & Gynecology

## 2017-11-21 ENCOUNTER — Encounter: Payer: Self-pay | Admitting: *Deleted

## 2017-12-12 ENCOUNTER — Ambulatory Visit: Payer: Self-pay | Admitting: Nurse Practitioner

## 2021-02-28 ENCOUNTER — Other Ambulatory Visit: Payer: Self-pay

## 2021-02-28 ENCOUNTER — Emergency Department (HOSPITAL_BASED_OUTPATIENT_CLINIC_OR_DEPARTMENT_OTHER)
Admission: EM | Admit: 2021-02-28 | Discharge: 2021-02-28 | Disposition: A | Discharge status: Home / Self Care | Payer: PRIVATE HEALTH INSURANCE | Attending: Emergency Medicine | Admitting: Emergency Medicine

## 2021-02-28 ENCOUNTER — Emergency Department (HOSPITAL_BASED_OUTPATIENT_CLINIC_OR_DEPARTMENT_OTHER): Payer: PRIVATE HEALTH INSURANCE

## 2021-02-28 ENCOUNTER — Encounter (HOSPITAL_BASED_OUTPATIENT_CLINIC_OR_DEPARTMENT_OTHER): Payer: Self-pay

## 2021-02-28 DIAGNOSIS — J069 Acute upper respiratory infection, unspecified: Secondary | ICD-10-CM | POA: Insufficient documentation

## 2021-02-28 DIAGNOSIS — R059 Cough, unspecified: Secondary | ICD-10-CM | POA: Diagnosis present

## 2021-02-28 DIAGNOSIS — U071 COVID-19: Secondary | ICD-10-CM | POA: Insufficient documentation

## 2021-02-28 MED ORDER — AZITHROMYCIN 250 MG PO TABS: 250.0000 mg | ORAL_TABLET | Freq: Every day | ORAL | 0 refills | Status: AC

## 2021-02-28 NOTE — ED Notes (Addendum)
Asked the PT for urine sample, PT stated she didn't want to give one.

## 2021-02-28 NOTE — ED Triage Notes (Signed)
Pt c/o productive cough with congestion since Tuesday. States had a sore throat but better now. Denies any other complaints.

## 2021-02-28 NOTE — ED Provider Notes (Signed)
[facility] EMERGENCY DEPARTMENT Provider Note   CSN: [id number] Arrival date & time: 02/28/21  1052     History Chief Complaint  Patient presents with   Cough    Anne Mcdowell is a 35 y.o. female.  HPI  35 year old female presents emergency department sinus congestion and cough.  A little over a week ago she thought she had a sinus infection, this is improved but she continues with congestion and sinus pain.  She is now dealing with cough that is productive of thick green sputum.  Denies any fever but she has had chills.  No recent COVID/flu exposure.  Past Medical History:  Diagnosis Date   Genital warts    Lupus (HCC)    Short cervical length during pregnancy 06/26/2015   [redacted]w[redacted]d 1.1 cm s/p BMZ 10/6 and 10/7  Pessary placed 10/7  Recommendations as per MFM: 1. TVUS q2 weeks thereafter until 28 weeks. 2. Preterm labor precautions 3. prometrium 200mg  pv qhs until 36 weeks 4. 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 No pertinent family history.  Social History   Tobacco Use   Smoking status: Never   Smokeless tobacco: Never  Substance Use Topics   Alcohol use: Yes    Comment: occasional   Drug use: No    Home Medications Prior to Admission medications   Medication Sig Start Date End Date Taking? Authorizing Provider  azithromycin (ZITHROMAX Z-PAK) 250 MG tablet Take 1 tablet (250  mg total) by mouth daily. Take two tabs, 500 mg once the first day. Then take one tab, 250 mg once a day for the last four days. 02/28/21  Yes Heidie Krall, 04/30/21, DO  acetaminophen (TYLENOL) 500 MG tablet Take 500 mg by mouth every 6 (six) hours as needed for headache. Reported on 02/26/2016    [provider]  calcium carbonate (TUMS - DOSED IN MG ELEMENTAL CALCIUM) 500 MG chewable tablet Chew 2 tablets by mouth daily as needed for indigestion or heartburn. Reported on 02/26/2016    [provider]  citalopram (CELEXA) 20 MG tablet Take 1 tablet (20 mg total) by mouth at bedtime. 03/29/17   05/30/17, MD  hydroxychloroquine (PLAQUENIL) 200 MG tablet Take 400 mg by mouth at bedtime.    [provider]  metroNIDAZOLE (FLAGYL) 500 MG tablet Take 1 tablet (500 mg total) by mouth 2 (two) times daily. 04/26/17   06/26/17, DO  traMADol (ULTRAM) 50 MG tablet Take 50 mg by mouth every 6 (six) hours as needed.    [provider]    Allergies    Penicillins  and Vancomycin  Review of Systems   Review of Systems  Constitutional:  Negative for chills and fever.  HENT:  Positive for congestion, sinus pressure and sinus pain.   Eyes:  Negative for visual disturbance.  Respiratory:  Positive for cough. Negative for chest tightness and shortness of breath.   Cardiovascular:  Negative for chest pain.  Gastrointestinal:  Negative for abdominal pain, diarrhea and vomiting.  Genitourinary:  Negative for dysuria.  Skin:  Negative for rash.  Neurological:  Negative for headaches.   Physical Exam Updated Vital Signs BP 116/80 (BP Location: Right Arm)   Pulse 83   Temp 98.2 F (36.8 C) (Oral)   Resp 18   Ht 5' (1.524 m)   Wt 72.1 kg   LMP 01/28/2021   SpO2 100%   BMI 31.05 kg/m   Physical Exam Vitals and nursing note reviewed.  Constitutional:      Appearance: Normal appearance.  HENT:     Head: Normocephalic.     Nose: Congestion and rhinorrhea present.      Mouth/Throat:     Mouth: Mucous membranes are moist.  Cardiovascular:     Rate and Rhythm: Normal rate.  Pulmonary:     Effort: Pulmonary effort is normal. No respiratory distress.     Comments: Wet cough Abdominal:     Palpations: Abdomen is soft.     Tenderness: There is no abdominal tenderness.  Skin:    General: Skin is warm.  Neurological:     Mental Status: She is alert and oriented to person, place, and time. Mental status is at baseline.  Psychiatric:        Mood and Affect: Mood normal.    ED Results / Procedures / Treatments   Labs (all labs ordered are listed, but only abnormal results are displayed) Labs Reviewed  SARS CORONAVIRUS 2 (TAT 6-24 HRS)    EKG None  Radiology DG Chest Port 1 View  Result Date: 02/28/2021 CLINICAL DATA:  Cough and chest pain for 4-5 days. EXAM: PORTABLE CHEST 1 VIEW COMPARISON:  None. FINDINGS: The cardiac silhouette, mediastinal and hilar contours are within normal limits given the AP projection and portable technique. The lungs are clear. No pleural effusions or pulmonary lesions. The bony thorax is intact. IMPRESSION: No acute cardiopulmonary findings. Electronically Signed   By: Rudie Meyer M.D.   On: 02/28/2021 12:13    Procedures Procedures   Medications Ordered in ED Medications - No data to display  ED Course  I have reviewed the triage vital signs and the nursing notes.  Pertinent labs & imaging results that were available during my care of the patient were reviewed by me and considered in my medical decision making (see chart for details).    MDM Rules/Calculators/A&P                          35 year old female presents emergency department with sinus pressure/pain and now productive cough.  Sounds like she had a viral process a little over a week ago, may be transitioning into a bacterial process with thick green phlegm, chills and worsening symptoms.  Vital signs are normal, no chest pain or other shortness of  breath, this sounds infectious and not like a PE. PERC 0. Chest x-ray shows no pneumonia.  She has been swabbed for COVID.  She will be treated for upper respiratory infection pending COVID results.  Patient will be discharged and treated as an outpatient.  Discharge plan and strict return to ED precautions discussed, patient verbalizes understanding and agreement.   Final Clinical Impression(s) / ED Diagnoses Final diagnoses:  Upper respiratory tract infection, unspecified type    Rx / DC Orders ED Discharge Orders          Ordered    azithromycin (ZITHROMAX Z-PAK) 250 MG tablet  Daily        02/28/21 1310             [place] Callas, Clabe Seal, DO 02/28/21 1314

## 2021-02-28 NOTE — ED Notes (Signed)
Pt refuses pregnancy test, states not possible, had recent menstrual cycle, without any sexual activity since then, concerned over cost of testing.  Provider made aware.

## 2021-02-28 NOTE — Discharge Instructions (Addendum)
You have been seen and discharged from the emergency department.  You have been diagnosed with an upper respiratory infection.  You have been prescribed a Z-Pak, take this as directed.  Follow-up your COVID swab tomorrow.  If this is positive then stop the antibiotic.  Quarantine per guidelines until asymptomatic.  Treat symptomatically.  Follow-up with your primary provider for reevaluation and further care. Take home medications as prescribed. If you have any worsening symptoms or further concerns for your health please return to an emergency department for further evaluation.

## 2021-03-01 LAB — SARS CORONAVIRUS 2 (TAT 6-24 HRS): SARS Coronavirus 2: POSITIVE — AB
